# Patient Record
Sex: Male | Born: 1955 | Race: White | Hispanic: No | Marital: Married | Smoking: Former smoker
Health system: Southern US, Community
[De-identification: ages and names within clinical notes are randomized; demographics above are authoritative.]

## PROBLEM LIST (undated history)

## (undated) DIAGNOSIS — R079 Chest pain, unspecified: Secondary | ICD-10-CM

## (undated) DIAGNOSIS — R001 Bradycardia, unspecified: Secondary | ICD-10-CM

## (undated) DIAGNOSIS — J45909 Unspecified asthma, uncomplicated: Secondary | ICD-10-CM

## (undated) DIAGNOSIS — J449 Chronic obstructive pulmonary disease, unspecified: Secondary | ICD-10-CM

## (undated) HISTORY — PX: APPENDECTOMY: SHX54

## (undated) HISTORY — DX: Chronic obstructive pulmonary disease, unspecified: J44.9

## (undated) HISTORY — DX: Bradycardia, unspecified: R00.1

## (undated) HISTORY — DX: Unspecified asthma, uncomplicated: J45.909

## (undated) HISTORY — DX: Chest pain, unspecified: R07.9

---

## 2012-05-28 ENCOUNTER — Ambulatory Visit (INDEPENDENT_AMBULATORY_CARE_PROVIDER_SITE_OTHER): Payer: BC Managed Care – PPO | Admitting: Physician Assistant

## 2012-05-28 VITALS — BP 110/90 | HR 62 | Temp 98.0°F | Resp 16 | Ht 73.0 in | Wt 182.0 lb

## 2012-05-28 DIAGNOSIS — R05 Cough: Secondary | ICD-10-CM

## 2012-05-28 DIAGNOSIS — J069 Acute upper respiratory infection, unspecified: Secondary | ICD-10-CM

## 2012-05-28 MED ORDER — HYDROCOD POLST-CHLORPHEN POLST 10-8 MG/5ML PO LQCR: 5.0000 mL | Freq: Two times a day (BID) | ORAL | Status: DC

## 2012-05-28 MED ORDER — GUAIFENESIN ER 1200 MG PO TB12: 1.0000 | ORAL_TABLET | Freq: Two times a day (BID) | ORAL | Status: DC

## 2012-05-28 NOTE — Progress Notes (Signed)
  Subjective:    Patient ID: Taylor Morton, male    DOB: [birth year], 56 y.o.   MRN: [id number]  HPI Pt presents to clinic with 5 d h/o cold symptoms or allergies he is unsure.  He has mild sinus congestion and a dry cough that is like a tickle in his throat and in his chest.  He has had some SOB with his coughing.  He has EIA but this is different.  He does not have an inhaler and he is not interested in one.  He has tried no OTC medications.   Review of Systems  Constitutional: Negative for fever and chills.  HENT: Positive for congestion, rhinorrhea and sinus pressure. Negative for ear pain, sore throat and postnasal drip.   Respiratory: Positive for cough, shortness of breath (2nd to the cough) and wheezing (rare).        Objective:   Physical Exam  Vitals reviewed. Constitutional: He is oriented to person, place, and time. He appears well-developed and well-nourished.  HENT:  Head: Normocephalic and atraumatic.  Right Ear: Hearing, tympanic membrane, external ear and ear canal normal.  Left Ear: Hearing, tympanic membrane, external ear and ear canal normal.  Nose: Mucosal edema present.  Mouth/Throat: Uvula is midline and oropharynx is clear and moist.  Eyes: Conjunctivae are normal.  Neck: Neck supple.  Cardiovascular: Normal rate, regular rhythm and normal heart sounds.  Exam reveals no gallop and no friction rub.   No murmur heard. Pulmonary/Chest: Effort normal and breath sounds normal. He has no wheezes (even with forced expiration).  Neurological: He is alert and oriented to person, place, and time.  Skin: Skin is warm and dry.  Psychiatric: He has a normal mood and affect. His behavior is normal. Judgment and thought content normal.          Assessment & Plan:   1. Cough  chlorpheniramine-HYDROcodone (TUSSIONEX PENNKINETIC ER) 10-8 MG/5ML LQCR, Guaifenesin (MUCINEX MAXIMUM STRENGTH) 1200 MG TB12   Pt to push fluids, Tylenol/motrin prn.  If pt is having a productive  cough in 5 days - let me know and I will send in an abx but I really think this is viral or allergic.

## 2012-06-02 ENCOUNTER — Telehealth: Payer: Self-pay

## 2012-06-02 DIAGNOSIS — R05 Cough: Secondary | ICD-10-CM

## 2012-06-02 MED ORDER — GUAIFENESIN ER 1200 MG PO TB12: 1.0000 | ORAL_TABLET | Freq: Two times a day (BID) | ORAL | Status: DC

## 2012-06-02 MED ORDER — HYDROCOD POLST-CHLORPHEN POLST 10-8 MG/5ML PO LQCR: 5.0000 mL | Freq: Two times a day (BID) | ORAL | Status: DC

## 2012-06-02 NOTE — Telephone Encounter (Signed)
Spoke with patient and will try refilled meds first-- if symptoms continue, will CB for abx.

## 2012-06-02 NOTE — Telephone Encounter (Signed)
Refills done.

## 2012-06-02 NOTE — Telephone Encounter (Signed)
chlorpheniramine-HYDROcodone (TUSSIONEX PENNKINETIC ER) 10-8 MG/5ML LQCR, Guaifenesin (MUCINEX MAXIMUM STRENGTH) 1200 MG TB12    Pt to push fluids, Tylenol/motrin prn. If pt is having a productive cough in 5 days - let me know and I will send in an abx but I really think this is viral or allergic.  From office visit 05/28/12 office visit with Maralyn Sago

## 2012-06-02 NOTE — Telephone Encounter (Signed)
LMOM to CB.  Get symptoms to see if abx are necessary.  Rx faxed in.

## 2012-06-02 NOTE — Telephone Encounter (Signed)
Pt would like a refill on the tussinex and musinex that he was prescribed, pt states that the coughing and congestion have not resolved yet. Best# [phone] Pharmacy: [facility] [address]

## 2012-08-30 ENCOUNTER — Ambulatory Visit (INDEPENDENT_AMBULATORY_CARE_PROVIDER_SITE_OTHER): Payer: BC Managed Care – PPO | Admitting: Physician Assistant

## 2012-08-30 VITALS — BP 117/78 | HR 62 | Temp 98.1°F | Resp 18 | Ht 74.0 in | Wt 181.0 lb

## 2012-08-30 DIAGNOSIS — J45909 Unspecified asthma, uncomplicated: Secondary | ICD-10-CM

## 2012-08-30 DIAGNOSIS — R001 Bradycardia, unspecified: Secondary | ICD-10-CM

## 2012-08-30 DIAGNOSIS — R05 Cough: Secondary | ICD-10-CM

## 2012-08-30 DIAGNOSIS — R079 Chest pain, unspecified: Secondary | ICD-10-CM

## 2012-08-30 DIAGNOSIS — R1032 Left lower quadrant pain: Secondary | ICD-10-CM

## 2012-08-30 LAB — POCT URINALYSIS DIPSTICK
Bilirubin, UA: NEGATIVE
Glucose, UA: NEGATIVE
Leukocytes, UA: NEGATIVE
Spec Grav, UA: >=1.03

## 2012-08-30 LAB — POCT UA - MICROSCOPIC ONLY: Bacteria, U Microscopic: NEGATIVE

## 2012-08-30 MED ORDER — HYDROCOD POLST-CHLORPHEN POLST 10-8 MG/5ML PO LQCR: 5.0000 mL | Freq: Two times a day (BID) | ORAL | Status: DC | PRN

## 2012-08-30 MED ORDER — ALBUTEROL SULFATE HFA 108 (90 BASE) MCG/ACT IN AERS: 2.0000 | INHALATION_SPRAY | RESPIRATORY_TRACT | Status: DC | PRN

## 2012-08-30 NOTE — Patient Instructions (Signed)
Contact me with your preference for the next step in the evaluation of your abdominal pain: at CT scan (of the abdomen and pelvis) or a consult with the surgeon.

## 2012-08-30 NOTE — Progress Notes (Signed)
Subjective:    Patient ID: Taylor Morton, male    DOB: [birth year], 56 y.o.   MRN: [id number]  HPI This 56 y.o. male presents for evaluation of 2-3 weeks of left lower quadrant abdominal pain.  Stabbing, burning.  Constant, wax/wanes. SInce the pain started, he's had a GI bug with nausea, vomiting, diarhhea, all of which are now resolved.  Has had a cough for about a day, and requests a refill of Tussionex which he usually uses for cough.  He has asthma, but states that he doesn't have symptoms and doesn't use any maintenance or rescue meds. No other URI-type symptoms.  No fever, chills.  No urinary symptoms, but thinks it might be "a bladder problem." He has two hernias in the same general area, for which he was referred to surgery.  However, due to not having insurance at the time, he did not follow-up.  He is now employed and has insurance again.  He reports a history of low resting heart rate.  Past Medical History  Diagnosis Date  . Asthma    Past Surgical History  Procedure Date  . Appendectomy    Prior to Admission medications   Medication Sig Start Date End Date Taking? Authorizing Provider  CRANBERRY EXTRACT PO Take by mouth.   Yes Historical Provider, MD   No Known Allergies  History   Social History  . Marital Status: Married    Spouse Name: Annabelle Harman    Number of Children: 2  . Years of Education: 16   Occupational History  . School Adult nurse Issues   Social History Main Topics  . Smoking status: Former Games developer  . Smokeless tobacco: Never Used  . Alcohol Use: No  . Drug Use: No  . Sexually Active: Yes   Other Topics Concern  . Not on file   Social History Narrative   Lives with his wife.  Sons are both in college.   Family History  Problem Relation Age of Onset  . Cancer Father   . Arthritis Mother    Review of Systems As above.    Objective:   Physical Exam  Blood pressure 117/78, pulse 62, temperature 98.1 F (36.7  C), temperature source Oral, resp. rate 18, height 6\' 2"  (1.88 m), weight 181 lb (82.101 kg), SpO2 99.00%. Body mass index is 23.24 kg/(m^2). Well-developed, well nourished WM who is awake, alert and oriented, in NAD. HEENT: [place]/AT, PERRL, EOMI.  Sclera and conjunctiva are clear.  EAC are patent, TMs are normal in appearance. Nasal mucosa is pink and moist. OP is clear. Neck: supple, non-tender, no lymphadenopathy, thyromegaly. Heart: RRR, no murmur Lungs: normal effort, CTA Abdomen: normo-active bowel sounds, supple, no mass or organomegaly. Mild tenderness on the LEFT, just above the inguinal fold.  No palpable mass, bulge or hernia defect. Extremities: no cyanosis, clubbing or edema. Skin: warm and dry without rash. Psychologic: good mood and appropriate affect, normal speech and behavior.   Results for orders placed in visit on 08/30/12  POCT UA - MICROSCOPIC ONLY      Component Value Range   WBC, Ur, HPF, POC 1-5     RBC, urine, microscopic 0-1     Bacteria, U Microscopic neg     Mucus, UA large     Epithelial cells, urine per micros neg     Crystals, Ur, HPF, POC calcium oxalate     Casts, Ur, LPF, POC neg     Yeast,  UA neg    POCT URINALYSIS DIPSTICK      Component Value Range   Color, UA yellow     Clarity, UA clear     Glucose, UA neg     Bilirubin, UA neg     Ketones, UA trace     Spec Grav, UA >=1.030     Blood, UA neg     pH, UA 5.5     Protein, UA neg     Urobilinogen, UA 0.2     Nitrite, UA neg     Leukocytes, UA Negative     EKG reviewed with Dr. Dareen Piano: NSR, bradycardia. No evidence of ischemia or LVH.    Assessment & Plan:   1. Abdominal pain, LLQ  POCT UA - Microscopic Only, POCT urinalysis dipstick  2. Chest pain  EKG 12-Lead, Ambulatory referral to Cardiology  3. Cough  chlorpheniramine-HYDROcodone (TUSSIONEX PENNKINETIC ER) 10-8 MG/5ML LQCR  4. Asthma  albuterol (PROVENTIL HFA;VENTOLIN HFA) 108 (90 BASE) MCG/ACT inhaler   He will call me with his  choice for further evaluation of the pain, which I suspect is a hernia. We can start with a surgery consult, or with a CT scan. Proceed with cardiology eval, given the new CP.  I encourage him to use the albuterol inhaler as needed for cough, as it may be underlying asthma causing his cough.  He may need a maintenance product.  I encourage him to return in the next few months for a CPE and fasting labs.

## 2012-09-06 ENCOUNTER — Ambulatory Visit: Payer: Self-pay | Admitting: Cardiovascular Disease

## 2012-09-15 ENCOUNTER — Encounter: Payer: Self-pay | Admitting: *Deleted

## 2012-09-15 ENCOUNTER — Encounter: Payer: Self-pay | Admitting: Cardiology

## 2012-09-15 DIAGNOSIS — R001 Bradycardia, unspecified: Secondary | ICD-10-CM | POA: Insufficient documentation

## 2012-09-15 DIAGNOSIS — R079 Chest pain, unspecified: Secondary | ICD-10-CM | POA: Insufficient documentation

## 2012-09-15 DIAGNOSIS — J45909 Unspecified asthma, uncomplicated: Secondary | ICD-10-CM | POA: Insufficient documentation

## 2012-09-21 ENCOUNTER — Ambulatory Visit (HOSPITAL_COMMUNITY)
Admission: RE | Admit: 2012-09-21 | Discharge: 2012-09-21 | Disposition: A | Discharge status: Home / Self Care | Payer: BC Managed Care – PPO | Source: Ambulatory Visit | Attending: Cardiovascular Disease | Admitting: Cardiovascular Disease

## 2012-09-21 ENCOUNTER — Encounter: Payer: Self-pay | Admitting: Cardiovascular Disease

## 2012-09-21 ENCOUNTER — Ambulatory Visit (INDEPENDENT_AMBULATORY_CARE_PROVIDER_SITE_OTHER): Payer: BC Managed Care – PPO | Admitting: Cardiovascular Disease

## 2012-09-21 VITALS — BP 121/80 | HR 59 | Resp 16 | Ht 73.0 in | Wt 184.0 lb

## 2012-09-21 DIAGNOSIS — R079 Chest pain, unspecified: Secondary | ICD-10-CM | POA: Insufficient documentation

## 2012-09-21 NOTE — Patient Instructions (Addendum)
Your physician has requested that you have an exercise tolerance test. For further information please visit [email or web address] Please also follow instruction sheet, as given.  A chest x-ray takes a picture of the organs and structures inside the chest, including the heart, lungs, and blood vessels. This test can show several things, including, whether the heart is enlarges; whether fluid is building up in the lungs; and whether pacemaker / defibrillator leads are still in place.  Your physician recommends that you continue on your current medications as directed. Please refer to the Current Medication list given to you today.  Follow up with Dr. Eden Emms as needed. We will call you with your results.

## 2012-09-21 NOTE — Assessment & Plan Note (Signed)
PRN inhalers per urgent care No active wheezing

## 2012-09-21 NOTE — Progress Notes (Signed)
Patient ID: Taylor Morton, male   DOB: Dec 04, 1955, 56 y.o.   MRN: [id number] 56 yo seen at urgent care 3 weeks ago for chest pain.  Atypical.  Not always exertional Waxed and waned for a few hours radiated to left arm  Arm numbness lasted longer than chest pain.  At urgent care negative enzymes.  Some stress at school teaching.  Since then has been active and run last 5 days with no issues.  No dyspnea palpitations or syncope.  Quit smoking over 20 years ago.  Has some shoulder problems and can hurt if he sleeps on them wrong. Has had pains in chest before he attributed to indigestion.  No gi overtones this time.  Currently feels good   ROS: Denies fever, malais, weight loss, blurry vision, decreased visual acuity, cough, sputum, SOB, hemoptysis, pleuritic pain, palpitaitons, heartburn, abdominal pain, melena, lower extremity edema, claudication, or rash.  All other systems reviewed and negative   General: Affect appropriate Healthy:  appears stated age HEENT: normal Neck supple with no adenopathy JVP normal no bruits no thyromegaly Lungs clear with no wheezing and good diaphragmatic motion Heart:  S1/S2 no murmur,rub, gallop or click PMI normal Abdomen: benighn, BS positve, no tenderness, no AAA no bruit.  No HSM or HJR Distal pulses intact with no bruits No edema Neuro non-focal Skin warm and dry No muscular weakness  Medications Current Outpatient Prescriptions  Medication Sig Dispense Refill  . chlorpheniramine-HYDROcodone (TUSSIONEX PENNKINETIC ER) 10-8 MG/5ML LQCR Take 5 mLs by mouth every 12 (twelve) hours as needed (cough).  140 mL  0  . CRANBERRY EXTRACT PO Take by mouth.        Allergies Review of patient's allergies indicates no known allergies.  Family History: Family History  Problem Relation Age of Onset  . Cancer Father   . Arthritis Mother     Social History: History   Social History  . Marital Status: Married    Spouse Name: Annabelle Harman    Number of Children: 2   . Years of Education: 16   Occupational History  . School Adult nurse Issues   Social History Main Topics  . Smoking status: Former Games developer  . Smokeless tobacco: Never Used  . Alcohol Use: No  . Drug Use: No  . Sexually Active: Yes   Other Topics Concern  . Not on file   Social History Narrative   Lives with his wife.  Sons are both in college.    Electrocardiogram:  NSR normal ECG done 08/30/12  Assessment and Plan

## 2012-09-21 NOTE — Assessment & Plan Note (Signed)
Atypical with normal exam and ECG  F/U CXR given body habitus (R/O marfans check mediastinum)  F/U exercise ETT

## 2012-09-21 NOTE — Assessment & Plan Note (Signed)
Physiologic with no signs of pathologic slow HR

## 2012-09-26 ENCOUNTER — Telehealth: Payer: Self-pay | Admitting: Cardiovascular Disease

## 2012-09-26 NOTE — Telephone Encounter (Signed)
Pt rtn call, deleted message , not sure who called or why

## 2012-10-12 ENCOUNTER — Ambulatory Visit (INDEPENDENT_AMBULATORY_CARE_PROVIDER_SITE_OTHER): Payer: BC Managed Care – PPO | Admitting: Cardiovascular Disease

## 2012-10-12 DIAGNOSIS — R079 Chest pain, unspecified: Secondary | ICD-10-CM

## 2012-10-12 NOTE — Procedures (Signed)
Exercise Treadmill Test  Pre-Exercise Testing Evaluation Rhythm: normal sinus  Rate: 70     Test  Exercise Tolerance Test Ordering MD: Charlton Haws, MD  Interpreting MD: Charlton Haws, MD  Unique Test No: 1   Treadmill:  1  Indication for ETT: chest pain - rule out ischemia  Contraindication to ETT: No   Stress Modality: exercise - treadmill  Cardiac Imaging Performed: non   Protocol: standard Bruce - maximal  Max BP:  154 77  Max MPHR (bpm):   1 85% MPR (bpm):     MPHR obtained (bpm):  162 % MPHR obtained:   99%  Reached 85% MPHR (min:sec):    Total Exercise Time (min-sec):  8:00  Workload in METS:  13.1 Borg Scale: 18  Reason ETT Terminated:  fatigue    ST Segment Analysis At Rest: normal ST segments - no evidence of significant ST depression With Exercise: no evidence of significant ST depression  Other Information Arrhythmia:  No Angina during ETT:  absent (0) Quality of ETT:  diagnostic  ETT Interpretation:  normal - no evidence of ischemia by ST analysis  Comments: Normal ETT  Recommendations: F/U Primary

## 2013-05-31 ENCOUNTER — Ambulatory Visit: Payer: BC Managed Care – PPO

## 2013-05-31 ENCOUNTER — Ambulatory Visit (INDEPENDENT_AMBULATORY_CARE_PROVIDER_SITE_OTHER): Payer: BC Managed Care – PPO | Admitting: Family Medicine

## 2013-05-31 ENCOUNTER — Emergency Department (HOSPITAL_COMMUNITY)
Admission: EM | Admit: 2013-05-31 | Discharge: 2013-05-31 | Disposition: A | Discharge status: Home / Self Care | Payer: BC Managed Care – PPO | Attending: Emergency Medicine | Admitting: Emergency Medicine

## 2013-05-31 ENCOUNTER — Encounter (HOSPITAL_COMMUNITY): Payer: Self-pay | Admitting: Emergency Medicine

## 2013-05-31 VITALS — BP 170/100 | HR 56 | Temp 97.9°F | Resp 18 | Ht 73.0 in | Wt 174.0 lb

## 2013-05-31 DIAGNOSIS — M542 Cervicalgia: Secondary | ICD-10-CM

## 2013-05-31 DIAGNOSIS — R05 Cough: Secondary | ICD-10-CM

## 2013-05-31 DIAGNOSIS — R079 Chest pain, unspecified: Secondary | ICD-10-CM

## 2013-05-31 DIAGNOSIS — Z8679 Personal history of other diseases of the circulatory system: Secondary | ICD-10-CM | POA: Insufficient documentation

## 2013-05-31 DIAGNOSIS — M25512 Pain in left shoulder: Secondary | ICD-10-CM

## 2013-05-31 DIAGNOSIS — Z87891 Personal history of nicotine dependence: Secondary | ICD-10-CM | POA: Insufficient documentation

## 2013-05-31 DIAGNOSIS — J45909 Unspecified asthma, uncomplicated: Secondary | ICD-10-CM | POA: Insufficient documentation

## 2013-05-31 DIAGNOSIS — R9431 Abnormal electrocardiogram [ECG] [EKG]: Secondary | ICD-10-CM

## 2013-05-31 DIAGNOSIS — R0789 Other chest pain: Secondary | ICD-10-CM

## 2013-05-31 DIAGNOSIS — M25519 Pain in unspecified shoulder: Secondary | ICD-10-CM | POA: Insufficient documentation

## 2013-05-31 DIAGNOSIS — R030 Elevated blood-pressure reading, without diagnosis of hypertension: Secondary | ICD-10-CM

## 2013-05-31 LAB — BASIC METABOLIC PANEL
Calcium: 9.1 mg/dL (ref 8.4–10.5)
GFR calc non Af Amer: >90 mL/min (ref >90)
Glucose, Bld: 99 mg/dL (ref 70–99)
Sodium: 135 mEq/L (ref 135–145)

## 2013-05-31 LAB — POCT I-STAT TROPONIN I: Troponin i, poc: 0.01 ng/mL (ref 0.00–0.08)

## 2013-05-31 LAB — CBC
MCH: 32.5 pg (ref 26.0–34.0)
MCHC: 36 g/dL (ref 30.0–36.0)
Platelets: 253 10*3/uL (ref 150–400)
RBC: 4.52 MIL/uL (ref 4.22–5.81)
RDW: 12.3 % (ref 11.5–15.5)

## 2013-05-31 MED ORDER — HYDROCOD POLST-CHLORPHEN POLST 10-8 MG/5ML PO LQCR: 5.0000 mL | Freq: Two times a day (BID) | ORAL | Status: DC | PRN

## 2013-05-31 NOTE — Patient Instructions (Signed)
Your EKG has changed since last checked.  These changes are not specific, but due to the type of pain you are having - further workup in the emergency room is indicated. Go to [facility] emergency room.  I will let them know you are on your way. If any change in your symptoms on your way there - pull over and call 911. If follow up needed after the emergency room, can return here at your convenience.

## 2013-05-31 NOTE — ED Provider Notes (Signed)
CSN: [id number]     Arrival date & time 05/31/13  1157 History   First MD Initiated Contact with Patient 05/31/13 1236     Chief Complaint  Patient presents with  . Chest Pain  . Shoulder Pain   (Consider location/radiation/quality/duration/timing/severity/associated sxs/prior Treatment) Patient is a 57 y.o. male presenting with chest pain and shoulder pain.  Chest Pain Shoulder Pain Associated symptoms include chest pain.   Pt reports about 10 days of moderate aching L shoulder and neck pain, improved with massage. Today he noticed some aching in L upper chest wall, worse with breathing deep. He has been running and paddling kayak recently without any exacerbation in symptoms. Seen at [facility] and sent to the ED for further evaluation. He was seen at [facility] for similar about 9 months ago and had normal exercise stress test then. Reports travel about 5 weeks ago but none in last 2 weeks.   Past Medical History  Diagnosis Date  . Asthma   . Chest pain   . Bradycardia    Past Surgical History  Procedure Laterality Date  . Appendectomy     Family History  Problem Relation Age of Onset  . Cancer Father   . Arthritis Mother    History  Substance Use Topics  . Smoking status: Former Games developer  . Smokeless tobacco: Never Used  . Alcohol Use: No    Review of Systems  Cardiovascular: Positive for chest pain.   All other systems reviewed and are negative except as noted in HPI.   Allergies  Review of patient's allergies indicates no known allergies.  Home Medications   Current Outpatient Rx  Name  Route  Sig  Dispense  Refill  . ibuprofen (ADVIL,MOTRIN) 200 MG tablet   Oral   Take 200 mg by mouth every 6 (six) hours as needed for pain (pain).         . chlorpheniramine-HYDROcodone (TUSSIONEX PENNKINETIC ER) 10-8 MG/5ML LQCR   Oral   Take 5 mLs by mouth every 12 (twelve) hours as needed (cough).   140 mL   0   . CRANBERRY EXTRACT PO   Oral   Take by mouth.           BP 168/76  Pulse 47  Temp(Src) 98.5 F (36.9 C) (Oral)  Resp 18  SpO2 97% Physical Exam  Nursing note and vitals reviewed. Constitutional: He is oriented to person, place, and time. He appears well-developed and well-nourished.  HENT:  Head: Normocephalic and atraumatic.  Eyes: EOM are normal. Pupils are equal, round, and reactive to light.  Neck: Normal range of motion. Neck supple.  Cardiovascular: Normal rate, normal heart sounds and intact distal pulses.   Pulmonary/Chest: Effort normal and breath sounds normal. No respiratory distress. He has no wheezes. He has no rales. He exhibits no tenderness.  Abdominal: Bowel sounds are normal. He exhibits no distension. There is no tenderness.  Musculoskeletal: Normal range of motion. He exhibits no edema and no tenderness.  Neurological: He is alert and oriented to person, place, and time. He has normal strength. No cranial nerve deficit or sensory deficit.  Skin: Skin is warm and dry. No rash noted.  Psychiatric: He has a normal mood and affect.    ED Course  Procedures (including critical care time) Labs Review Labs Reviewed  BASIC METABOLIC PANEL  CBC  POCT I-STAT TROPONIN I   Imaging Review Dg Chest 2 View  05/31/2013   *RADIOLOGY REPORT*  Clinical Data: Cough.  CHEST - 2 VIEW  Comparison: None.  Findings: There is no evidence of acute infiltrate, pulmonary edema, nodule, pleural fluid or pneumothorax.  Lungs mildly hyperinflated.  The heart size and mediastinal contours are within normal limits.  Bony thorax is unremarkable.  IMPRESSION: Mild hyperinflation.  No acute findings.  Clinically significant discrepancy from primary report, if provided: None   Original Report Authenticated By: Irish Lack, M.D.   Dg Cervical Spine Complete  05/31/2013   *RADIOLOGY REPORT*  Clinical Data: Neck pain.  CERVICAL SPINE - COMPLETE 4+ VIEW  Comparison: None.  Findings: The cervical spine shows normal alignment.  Minimal osteophyte  formation is noted at C5-6 and there is mild disc space narrowing at C6-7.  No fracture or subluxation is identified. Oblique views show no significant bony foraminal stenosis.  No soft tissue swelling is identified.  IMPRESSION: Mild cervical spondylosis at C5-6 and C6-7.  Clinically significant discrepancy from primary report, if provided: None   Original Report Authenticated By: Irish Lack, M.D.    MDM   1. Shoulder pain, left      Date: 05/31/2013  Rate: 49  Rhythm: sinus bradycardia  QRS Axis: normal  Intervals: normal  ST/T Wave abnormalities: normal  Conduction Disutrbances:none  Narrative Interpretation:   Old EKG Reviewed: unchanged  Unremarkable EKG, history not concerning for ACS or PE. Labs and imagine ordered in [facility] reviewed and unremarkable. Pt with MSK shoulder pain, advised to avoid strenuous activity. PCP followup.      Rudolph Daoust B. Bernette Mayers, MD 05/31/13 1332

## 2013-05-31 NOTE — ED Notes (Signed)
Pt sent from [facility] for further eval of left shoulder pain and chest pain; pt sts pain x 10 days; pt sts thinks could be pulled muscles; pt noted to be htn

## 2013-05-31 NOTE — Progress Notes (Signed)
Subjective:    Patient ID: Taylor Morton, male    DOB: Aug 04, 1956, 57 y.o.   MRN: [id number]  HPI Taylor Morton is a 57 y.o. male  Has had some pain in L upper chest for past week, hurts to take in deep breath. No pressure. Not short of breath.  Able to run yesterday - no pain then. Hurts more in L neck/upper back/below shoulder blade and front L upper chest - this area hurts with deep breath. Cough past week - min clear phlegm in am, no fever. Has had belching without heartburn. No diaphoresis, no N/V.  No known hx of HTN.   Tx: few Advil - no relief.   Eval for chest pain in 08/2012 - D.r Nishan. Atypical with normal EKG, ETT wnl, no st changes, 13.1 mets 10/12/12.   dilated eye exam this morning with phenylephrine.   Administrator, arts. Nonsmoker/former smoker.   Additional history: drove to [place] few weeks ago, but no air travel, no new calf pain/swelling.  Review of Systems  Constitutional: Negative for fever and chills.  Respiratory: Positive for cough. Negative for shortness of breath.   Cardiovascular: Positive for chest pain (upper left chest. ).  Musculoskeletal: Positive for arthralgias.  Skin: Negative for rash.       Objective:   Physical Exam  Vitals reviewed. Constitutional: He is oriented to person, place, and time. He appears well-developed and well-nourished.  HENT:  Head: Normocephalic and atraumatic.  Eyes: EOM are normal. Pupils are equal, round, and reactive to light.  Neck: No JVD present. Carotid bruit is not present.  Cardiovascular: Normal rate, regular rhythm and normal heart sounds.   No murmur heard. Pulmonary/Chest: Effort normal and breath sounds normal. No respiratory distress. He has no rales.  Musculoskeletal: He exhibits no edema.       Cervical back: He exhibits decreased range of motion (some neck to shoulder blade tightness with L rotation, L lateral flexion - slightly decreased. ). He exhibits no tenderness and no bony tenderness.  Neurological:  He is alert and oriented to person, place, and time.  Skin: Skin is warm and dry.  Psychiatric: He has a normal mood and affect. His behavior is normal.   [facility] reading (PRIMARY) by  Dr. Neva Seat:  CXR:no apparent pneumothorax, NAD C Spine: minimal decreased lordosis.   EKG: sinus bradycardia at rate 48, incomplete RBBB-  RSR' in V1 - also seen in 08/30/12 EKG, but new TWI in III, nonspecific change in AVF, and slight elevation in ST segments V2-V3 vs. Early repol- appears more prevalent than in 08/2012 EKG. - discussed with cardiologist - nonspecific findings, but did recommend ER eval for further workup.      Assessment & Plan:  Taylor Morton is a 57 y.o. male Chest pain, unspecified, Neck pain  - Plan: EKG 12-Lead, EKG 12-Lead, Nonspecific abnormal electrocardiogram (ECG) (EKG), slight Cough -  Nonspecific EKG cahnge, but hx of prolonged car travel few weeks ago - with pleuritic type pain. Exam and hx suggest cervical source, but due to EKG change and travel - will have further workup in ER for other causes, including possible D dimer or CT of chest - but TBD by eval in ER. Discussed with pt - understanding expressed.   Elevated blood pressure - no hx of HTN - had dilated eye exam this am. May be due to these meds. Can recheck.   Patient Instructions  Your EKG has changed since last checked.  These changes are not specific, but  due to the type of pain you are having - further workup in the emergency room is indicated. Go to [facility] emergency room.  I will let them know you are on your way. If any change in your symptoms on your way there - pull over and call 911. If follow up needed after the emergency room, can return here at your convenience.

## 2014-04-16 ENCOUNTER — Ambulatory Visit (INDEPENDENT_AMBULATORY_CARE_PROVIDER_SITE_OTHER): Payer: BC Managed Care – PPO

## 2014-04-16 ENCOUNTER — Ambulatory Visit (INDEPENDENT_AMBULATORY_CARE_PROVIDER_SITE_OTHER): Payer: BC Managed Care – PPO | Admitting: Family Medicine

## 2014-04-16 VITALS — BP 140/82 | HR 68 | Temp 98.2°F | Resp 16 | Ht 74.0 in | Wt 179.4 lb

## 2014-04-16 DIAGNOSIS — R3129 Other microscopic hematuria: Secondary | ICD-10-CM

## 2014-04-16 DIAGNOSIS — IMO0002 Reserved for concepts with insufficient information to code with codable children: Secondary | ICD-10-CM

## 2014-04-16 DIAGNOSIS — S20219A Contusion of unspecified front wall of thorax, initial encounter: Secondary | ICD-10-CM

## 2014-04-16 DIAGNOSIS — S3991XA Unspecified injury of abdomen, initial encounter: Secondary | ICD-10-CM

## 2014-04-16 DIAGNOSIS — S20211A Contusion of right front wall of thorax, initial encounter: Secondary | ICD-10-CM

## 2014-04-16 DIAGNOSIS — S2239XA Fracture of one rib, unspecified side, initial encounter for closed fracture: Secondary | ICD-10-CM

## 2014-04-16 DIAGNOSIS — S2231XA Fracture of one rib, right side, initial encounter for closed fracture: Secondary | ICD-10-CM

## 2014-04-16 LAB — POCT URINALYSIS DIPSTICK
BILIRUBIN UA: NEGATIVE
GLUCOSE UA: NEGATIVE
KETONES UA: NEGATIVE
Leukocytes, UA: NEGATIVE
NITRITE UA: NEGATIVE
PH UA: 6.5
Protein, UA: NEGATIVE
SPEC GRAV UA: 1.015
Urobilinogen, UA: 0.2

## 2014-04-16 LAB — POCT UA - MICROSCOPIC ONLY
Casts, Ur, LPF, POC: NEGATIVE
Crystals, Ur, HPF, POC: NEGATIVE
EPITHELIAL CELLS, URINE PER MICROSCOPY: NEGATIVE
WBC, UR, HPF, POC: NEGATIVE
Yeast, UA: NEGATIVE

## 2014-04-16 MED ORDER — MELOXICAM 15 MG PO TABS: 15.0000 mg | ORAL_TABLET | Freq: Every day | ORAL | Status: DC

## 2014-04-16 MED ORDER — HYDROCODONE-ACETAMINOPHEN 5-325 MG PO TABS: 1.0000 | ORAL_TABLET | Freq: Four times a day (QID) | ORAL | Status: DC | PRN

## 2014-04-16 NOTE — Patient Instructions (Addendum)
Do not use the meloxicam with any other otc pain medication other than tylenol/acetaminophen - so no aleve, ibuprofen, motrin, advil, etc.  Rib Contusion A rib contusion (bruise) can occur by a blow to the chest or by a fall against a hard object. Usually these will be much better in a couple weeks. If X-rays were taken today and there are no broken bones (fractures), the diagnosis of bruising is made. However, broken ribs may not show up for several days, or may be discovered later on a routine X-ray when signs of healing show up. If this happens to you, it does not mean that something was missed on the X-ray, but simply that it did not show up on the first X-rays. Earlier diagnosis will not usually change the treatment. HOME CARE INSTRUCTIONS   Avoid strenuous activity. Be careful during activities and avoid bumping the injured ribs. Activities that pull on the injured ribs and cause pain should be avoided, if possible.  For the first day or two, an ice pack used every 20 minutes while awake may be helpful. Put ice in a plastic bag and put a towel between the bag and the skin.  Eat a normal, well-balanced diet. Drink plenty of fluids to avoid constipation.  Take deep breaths several times a day to keep lungs free of infection. Try to cough several times a day. Splint the injured area with a pillow while coughing to ease pain. Coughing can help prevent pneumonia.  Wear a rib belt or binder only if told to do so by your caregiver. If you are wearing a rib belt or binder, you must do the breathing exercises as directed by your caregiver. If not used properly, rib belts or binders restrict breathing which can lead to pneumonia.  Only take over-the-counter or prescription medicines for pain, discomfort, or fever as directed by your caregiver. SEEK MEDICAL CARE IF:   You or your child has an oral temperature above 102 F (38.9 C).  Your baby is older than 3 months with a rectal temperature of  100.5 F (38.1 C) or higher for more than 1 day.  You develop a cough, with thick or bloody sputum. SEEK IMMEDIATE MEDICAL CARE IF:   You have difficulty breathing.  You feel sick to your stomach (nausea), have vomiting or belly (abdominal) pain.  You have worsening pain, not controlled with medications, or there is a change in the location of the pain.  You develop sweating or radiation of the pain into the arms, jaw or shoulders, or become light headed or faint.  You or your child has an oral temperature above 102 F (38.9 C), not controlled by medicine.  Your or your baby is older than 3 months with a rectal temperature of 102 F (38.9 C) or higher.  Your baby is 503 months old or younger with a rectal temperature of 100.4 F (38 C) or higher. MAKE SURE YOU:   Understand these instructions.  Will watch your condition.  Will get help right away if you are not doing well or get worse. Document Released: 06/08/2001 Document Revised: 01/08/2013 Document Reviewed: 05/01/2008 [facility] Patient Information 2015 [employer] [place]. This information is not intended to replace advice given to you by your health care provider. Make sure you discuss any questions you have with your health care provider.
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
## 2014-10-11 ENCOUNTER — Ambulatory Visit (INDEPENDENT_AMBULATORY_CARE_PROVIDER_SITE_OTHER): Payer: BC Managed Care – PPO | Admitting: Emergency Medicine

## 2014-10-11 ENCOUNTER — Ambulatory Visit (INDEPENDENT_AMBULATORY_CARE_PROVIDER_SITE_OTHER): Payer: BC Managed Care – PPO

## 2014-10-11 VITALS — BP 132/82 | HR 77 | Temp 98.0°F | Resp 18 | Ht 73.0 in | Wt 184.0 lb

## 2014-10-11 DIAGNOSIS — R059 Cough, unspecified: Secondary | ICD-10-CM

## 2014-10-11 DIAGNOSIS — R05 Cough: Secondary | ICD-10-CM

## 2014-10-11 DIAGNOSIS — J209 Acute bronchitis, unspecified: Secondary | ICD-10-CM

## 2014-10-11 DIAGNOSIS — R197 Diarrhea, unspecified: Secondary | ICD-10-CM

## 2014-10-11 DIAGNOSIS — Z1211 Encounter for screening for malignant neoplasm of colon: Secondary | ICD-10-CM

## 2014-10-11 LAB — POCT CBC
GRANULOCYTE PERCENT: 68.1 % (ref 37–80)
HEMATOCRIT: 45.2 % (ref 43.5–53.7)
HEMOGLOBIN: 15.6 g/dL (ref 14.1–18.1)
Lymph, poc: 1.5 (ref 0.6–3.4)
MCH, POC: 32 pg — AB (ref 27–31.2)
MCHC: 34.7 g/dL (ref 31.8–35.4)
MCV: 92.4 fL (ref 80–97)
MID (CBC): 0.5 (ref 0–0.9)
MPV: 7.6 fL (ref 0–99.8)
PLATELET COUNT, POC: 334 10*3/uL (ref 142–424)
POC Granulocyte: 4.2 (ref 2–6.9)
POC LYMPH %: 23.6 % (ref 10–50)
POC MID %: 8.3 %M (ref 0–12)
RBC: 4.89 M/uL (ref 4.69–6.13)
RDW, POC: 12.2 %
WBC: 6.2 10*3/uL (ref 4.6–10.2)

## 2014-10-11 MED ORDER — AZITHROMYCIN 250 MG PO TABS: ORAL_TABLET | ORAL | Status: DC

## 2014-10-11 NOTE — Progress Notes (Addendum)
Subjective:    Patient ID: Taylor Morton, male    DOB: 24-Jun-1956, 59 y.o.   MRN: [id number] This chart was scribed for Lesle Chris, MD by Jolene Provost, Medical Scribe. This patient was seen in Room 11 and the patient's care was started at 8:18 AM.  Chief Complaint  Patient presents with  . URI  . Headache    HPI HPI Comments: Taylor Morton is a 59 y.o. male who presents to [facility] complaining of URI that started two weeks ago. Pt states that his sx started with diarrhea, and then he developed sinus congestion, HA and mild fever/chills. Pt endorses continued associated diarrhea, fatigue, fever, chills, HA, congestion, mildly productive cough with clear and yellow drainage. Pt has taken 12 hour mucinex PTA with some relief of sx. Pt denies sick contacts. Pt denies other medical problems. PT does not smoke.  Pt is a Radio producer.    Review of Systems  Constitutional: Positive for fever, chills and fatigue.  HENT: Positive for congestion, rhinorrhea, sinus pressure and sore throat.   Respiratory: Positive for cough.   Gastrointestinal: Positive for diarrhea.  Neurological: Positive for headaches.  Psychiatric/Behavioral: Positive for sleep disturbance.   patient states he has had some visual disturbance associated with a headache after working on the computer through the day yesterday. He does have a family history of macular degeneration and has not had a good thorough eye check for a year and a half     Objective:   Physical Exam  Constitutional: He is oriented to person, place, and time. He appears well-developed and well-nourished.  HENT:  Head: Normocephalic and atraumatic.  Mouth/Throat: No oropharyngeal exudate.  Nasal congestion.  Eyes: Pupils are equal, round, and reactive to light.  Neck: Neck supple. No thyromegaly present.  Cardiovascular: Normal rate and regular rhythm.   Pulmonary/Chest: Effort normal and breath sounds normal. No respiratory distress.  Few course  rhonchi.  Abdominal: Soft. There is no tenderness.  Neurological: He is alert and oriented to person, place, and time.  Skin: Skin is warm and dry.  Psychiatric: He has a normal mood and affect. His behavior is normal.  Nursing note and vitals reviewed.  Results for orders placed or performed in visit on 10/11/14  POCT CBC  Result Value Ref Range   WBC 6.2 4.6 - 10.2 K/uL   Lymph, poc 1.5 0.6 - 3.4   POC LYMPH PERCENT 23.6 10 - 50 %L   MID (cbc) 0.5 0 - 0.9   POC MID % 8.3 0 - 12 %M   POC Granulocyte 4.2 2 - 6.9   Granulocyte percent 68.1 37 - 80 %G   RBC 4.89 4.69 - 6.13 M/uL   Hemoglobin 15.6 14.1 - 18.1 g/dL   HCT, POC 57.8 46.9 - 53.7 %   MCV 92.4 80 - 97 fL   MCH, POC 32.0 (A) 27 - 31.2 pg   MCHC 34.7 31.8 - 35.4 g/dL   RDW, POC 62.9 %   Platelet Count, POC 334 142 - 424 K/uL   MPV 7.6 0 - 99.8 fL  [facility] reading (PRIMARY) by  Dr.Emanuel Campos there appears to be having markings worse on the left  no consolidated  areas.      Assessment & Plan:  We'll treat with a Z-Pak. He is to force fluids. He was referred to Dr. Elnoria Howard for screening colonoscopy. I suggested he get a good thorough eye exam. He does not 1 schedule that at the present time. I  also encouraged schedule for complete physical. He really does not want to do that at present time.I personally performed the services described in this documentation, which was scribed in my presence. The recorded information has been reviewed and is accurate.

## 2015-10-20 ENCOUNTER — Ambulatory Visit (INDEPENDENT_AMBULATORY_CARE_PROVIDER_SITE_OTHER): Payer: BC Managed Care – PPO

## 2015-10-20 ENCOUNTER — Ambulatory Visit (INDEPENDENT_AMBULATORY_CARE_PROVIDER_SITE_OTHER): Payer: BC Managed Care – PPO | Admitting: Family Medicine

## 2015-10-20 ENCOUNTER — Encounter: Payer: Self-pay | Admitting: Family Medicine

## 2015-10-20 VITALS — BP 160/90 | HR 63 | Temp 98.6°F | Wt 186.0 lb

## 2015-10-20 DIAGNOSIS — R05 Cough: Secondary | ICD-10-CM

## 2015-10-20 DIAGNOSIS — R042 Hemoptysis: Secondary | ICD-10-CM

## 2015-10-20 DIAGNOSIS — R059 Cough, unspecified: Secondary | ICD-10-CM

## 2015-10-20 DIAGNOSIS — R51 Headache: Secondary | ICD-10-CM | POA: Diagnosis not present

## 2015-10-20 DIAGNOSIS — J209 Acute bronchitis, unspecified: Secondary | ICD-10-CM

## 2015-10-20 DIAGNOSIS — R519 Headache, unspecified: Secondary | ICD-10-CM

## 2015-10-20 MED ORDER — AZITHROMYCIN 250 MG PO TABS: ORAL_TABLET | ORAL | Status: DC

## 2015-10-20 MED ORDER — HYDROCODONE-HOMATROPINE 5-1.5 MG/5ML PO SYRP: 5.0000 mL | ORAL_SOLUTION | Freq: Three times a day (TID) | ORAL | Status: DC | PRN

## 2015-10-20 NOTE — Patient Instructions (Signed)
Chest x-ray just shows thickened bronchial tubes consistent with bronchitis

## 2015-10-20 NOTE — Progress Notes (Signed)
Subjective:  This chart was scribed for Elvina Sidle MD, by Veverly Fells, at [facility].  This patient was seen in room 12 and the patient's care was started at 8:18 AM.   Chief Complaint  Patient presents with  . Cough    x 1 week, green product - today coughed up blood  . Sinusitis  . Headache     Patient ID: Taylor Morton, male    DOB: 1955/11/03, 60 y.o.   MRN: [id number]  HPI  HPI Comments: Taylor Morton is a 60 y.o. male who presents to the [facility] complaining of a productive cough/ hemoptysis (green mucous with "a little bit" blood) onset 1 week ago. He states that the blood was only present when he woke up first thing in the morning.  He states that the cough keeps him up at night.   He has associated symptoms of chest and back pain from coughing frequently and well as sinus pressure,congestion and a headache. His symptoms first started with wheezing.  Patient has been using Mucin-Ex but denies any relief.  He denies a sore throat. Patient still runs occasionally and swims as well.   Blood pressure left arm: 150/70  Patient is an Manufacturing systems engineer.   Patients cardiologist is Dr. Eden Emms. His last EKG was normal.     Patient Active Problem List   Diagnosis Date Noted  . Asthma   . Chest pain   . Bradycardia    Past Medical History  Diagnosis Date  . Asthma   . Chest pain   . Bradycardia    Past Surgical History  Procedure Laterality Date  . Appendectomy     No Known Allergies Prior to Admission medications   Medication Sig Start Date End Date Taking? Authorizing Provider  azithromycin (ZITHROMAX) 250 MG tablet Take 2 tabs PO x 1 dose, then 1 tab PO QD x 4 days 10/11/14   Collene Gobble, MD   Social History   Social History  . Marital Status: Married    Spouse Name: Annabelle Harman  . Number of Children: 2  . Years of Education: 16   Occupational History  . School Adult nurse  Issues   Social History Main Topics  . Smoking status: Former Games developer  . Smokeless tobacco: Never Used  . Alcohol Use: No  . Drug Use: No  . Sexual Activity: Yes   Other Topics Concern  . Not on file   Social History Narrative   Lives with his wife.  Sons are both in college.     Review of Systems  Constitutional: Negative for fever, chills and diaphoresis.  HENT: Positive for congestion and sinus pressure. Negative for sore throat.   Eyes: Negative for pain, redness and itching.  Respiratory: Positive for cough. Negative for choking and shortness of breath.   Cardiovascular: Positive for chest pain.  Gastrointestinal: Negative for nausea and vomiting.  Musculoskeletal: Positive for back pain. Negative for neck pain and neck stiffness.  Skin: Negative for color change and wound.  Neurological: Positive for headaches. Negative for seizures, syncope and speech difficulty.       Objective:   Physical Exam  Constitutional: He is oriented to person, place, and time. He appears well-developed and well-nourished. No distress.  HENT:  Head: Normocephalic and atraumatic.  Throat is mildly erythematous.  No adenopathy.   Eyes: Pupils are equal, round, and reactive to light.  Neck: Normal range of motion.  Pulmonary/Chest: Effort normal. No respiratory distress.  decreased breath sounds both sides   Musculoskeletal: Normal range of motion.  Neurological: He is alert and oriented to person, place, and time.  Skin: Skin is warm.  Psychiatric: He has a normal mood and affect. His behavior is normal.   Filed Vitals:   10/20/15 0812 10/20/15 0815  BP: 150/80 160/90  Pulse: 63   Temp: 98.6 F (37 C)   TempSrc: Oral   Weight: 186 lb (84.369 kg)   SpO2: 97%     [facility] (PRIMARY) x-ray report read by Dr. Milus Glazier MD,  Chest X Ray: he has bronchial thickening. No infiltrate, normal heart size.      Assessment & Plan:   This chart was scribed in my presence and reviewed by me  personally.    ICD-9-CM ICD-10-CM   1. Hemoptysis 786.30 R04.2 DG Chest 2 View     azithromycin (ZITHROMAX) 250 MG tablet     HYDROcodone-homatropine (HYCODAN) 5-1.5 MG/5ML syrup  2. Cough 786.2 R05 DG Chest 2 View     azithromycin (ZITHROMAX) 250 MG tablet     HYDROcodone-homatropine (HYCODAN) 5-1.5 MG/5ML syrup  3. Headache, unspecified headache type 784.0 R51      Signed, Elvina Sidle, MD

## 2015-11-07 ENCOUNTER — Ambulatory Visit (INDEPENDENT_AMBULATORY_CARE_PROVIDER_SITE_OTHER): Payer: BC Managed Care – PPO | Admitting: Family Medicine

## 2015-11-07 VITALS — BP 155/84 | HR 75 | Temp 98.7°F | Resp 16 | Ht 74.5 in | Wt 186.0 lb

## 2015-11-07 DIAGNOSIS — R030 Elevated blood-pressure reading, without diagnosis of hypertension: Secondary | ICD-10-CM

## 2015-11-07 DIAGNOSIS — IMO0001 Reserved for inherently not codable concepts without codable children: Secondary | ICD-10-CM

## 2015-11-07 DIAGNOSIS — J101 Influenza due to other identified influenza virus with other respiratory manifestations: Secondary | ICD-10-CM

## 2015-11-07 MED ORDER — HYDROCOD POLST-CPM POLST ER 10-8 MG/5ML PO SUER: 5.0000 mL | Freq: Two times a day (BID) | ORAL | Status: DC | PRN

## 2015-11-07 NOTE — Progress Notes (Signed)
Subjective:    Patient ID: Taylor Morton, male    DOB: 03-Aug-1956, 60 y.o.   MRN: [id number]  11/07/2015  Headache; Sinusitis; and Sore Throat   HPI This 60 y.o. male presents for evaluation of flu like symptoms today. Having headache, sinus pressure.  Coughing a lot today.  No fever but +chills/sweats.  +body aches in chest and shoulder.  No sore throat or ear pain; +tinnitus.  +rhinorrhea minimal; +nasal congestion; coughing a lot; no SOB; wheezing cough.  No n/v/d.  No flu vaccine.  Students at school with GI.  Tussionex, Mucinex.  Wife tested positive today for influenza A.    Blood pressure has been elevated on several occasions in the past; refuses medication; eats excessive salt in diet.   Evaluated two weeks ago; diagnosed with bronchitis; prescribed Zpack, Tussionex.  Improved with acute illness.  Review of Systems  Constitutional: Positive for chills and fatigue. Negative for fever, diaphoresis, activity change and appetite change.  HENT: Positive for congestion, rhinorrhea and tinnitus. Negative for ear pain, sore throat and trouble swallowing.   Respiratory: Positive for cough and wheezing. Negative for shortness of breath.   Cardiovascular: Negative for chest pain, palpitations and leg swelling.  Gastrointestinal: Negative for nausea, vomiting, abdominal pain and diarrhea.  Endocrine: Negative for cold intolerance, heat intolerance, polydipsia, polyphagia and polyuria.  Skin: Negative for color change, rash and wound.  Neurological: Positive for headaches. Negative for dizziness, tremors, seizures, syncope, facial asymmetry, speech difficulty, weakness, light-headedness and numbness.  Psychiatric/Behavioral: Negative for sleep disturbance and dysphoric mood. The patient is not nervous/anxious.     Past Medical History  Diagnosis Date  . Asthma   . Chest pain   . Bradycardia    Past Surgical History  Procedure Laterality Date  . Appendectomy     No Known  Allergies Current Outpatient Prescriptions  Medication Sig Dispense Refill  . chlorpheniramine-HYDROcodone (TUSSIONEX PENNKINETIC ER) 10-8 MG/5ML SUER Take 5 mLs by mouth every 12 (twelve) hours as needed for cough. 180 mL 0   No current facility-administered medications for this visit.   Social History   Social History  . Marital Status: Married    Spouse Name: Annabelle Harman  . Number of Children: 2  . Years of Education: 16   Occupational History  . School Adult nurse Issues   Social History Main Topics  . Smoking status: Former Games developer  . Smokeless tobacco: Never Used  . Alcohol Use: No  . Drug Use: No  . Sexual Activity: Yes   Other Topics Concern  . Not on file   Social History Narrative   Lives with his wife.  Sons are both in college.   Family History  Problem Relation Age of Onset  . Cancer Father   . Arthritis Mother        Objective:    BP 155/84 mmHg  Pulse 75  Temp(Src) 98.7 F (37.1 C)  Resp 16  Ht 6' 2.5" (1.892 m)  Wt 186 lb (84.369 kg)  BMI 23.57 kg/m2  SpO2 97% Physical Exam  Constitutional: He is oriented to person, place, and time. He appears well-developed and well-nourished. No distress.  HENT:  Head: Normocephalic and atraumatic.  Right Ear: Tympanic membrane, external ear and ear canal normal.  Left Ear: Tympanic membrane, external ear and ear canal normal.  Nose: Mucosal edema and rhinorrhea present. Right sinus exhibits no maxillary sinus tenderness and no frontal sinus tenderness. Left sinus exhibits  no maxillary sinus tenderness and no frontal sinus tenderness.  Mouth/Throat: Oropharynx is clear and moist.  Eyes: Conjunctivae and EOM are normal. Pupils are equal, round, and reactive to light.  Neck: Normal range of motion. Neck supple. Carotid bruit is not present. No thyromegaly present.  Cardiovascular: Normal rate, regular rhythm, normal heart sounds and intact distal pulses.  Exam reveals no gallop and  no friction rub.   No murmur heard. Pulmonary/Chest: Effort normal and breath sounds normal. He has no wheezes. He has no rales.  Abdominal: Soft. Bowel sounds are normal. He exhibits no distension and no mass. There is no tenderness. There is no rebound and no guarding.  Lymphadenopathy:    He has no cervical adenopathy.  Neurological: He is alert and oriented to person, place, and time. No cranial nerve deficit.  Skin: Skin is warm and dry. No rash noted. He is not diaphoretic.  Psychiatric: He has a normal mood and affect. His behavior is normal.  Nursing note and vitals reviewed.  Results for orders placed or performed in visit on 10/11/14  POCT CBC  Result Value Ref Range   WBC 6.2 4.6 - 10.2 K/uL   Lymph, poc 1.5 0.6 - 3.4   POC LYMPH PERCENT 23.6 10 - 50 %L   MID (cbc) 0.5 0 - 0.9   POC MID % 8.3 0 - 12 %M   POC Granulocyte 4.2 2 - 6.9   Granulocyte percent 68.1 37 - 80 %G   RBC 4.89 4.69 - 6.13 M/uL   Hemoglobin 15.6 14.1 - 18.1 g/dL   HCT, POC 96.0 45.4 - 53.7 %   MCV 92.4 80 - 97 fL   MCH, POC 32.0 (A) 27 - 31.2 pg   MCHC 34.7 31.8 - 35.4 g/dL   RDW, POC 09.8 %   Platelet Count, POC 334 142 - 424 K/uL   MPV 7.6 0 - 99.8 fL       Assessment & Plan:   1. Influenza A   2. Blood pressure elevated    -New. -Pt declined Tamiflu due to previous intolerance with n/v. -supportive care with rest, Tylenol or Ibuprofen, Mucinex DM. -Rx for Tussionex provided -recommend monitoring blood pressure closely at home; decrease salt intake.      No orders of the defined types were placed in this encounter.   Meds ordered this encounter  Medications  . chlorpheniramine-HYDROcodone (TUSSIONEX PENNKINETIC ER) 10-8 MG/5ML SUER    Sig: Take 5 mLs by mouth every 12 (twelve) hours as needed for cough.    Dispense:  180 mL    Refill:  0    GENERIC EQUIVALENT FINE.    No Follow-up on file.    Shoshannah Faubert Paulita Fujita, M.D. [facility] [address] [place], [place]  [address] [phone] phone [phone] fax

## 2015-11-07 NOTE — Patient Instructions (Signed)
1.  Afrin nasal spray twice daily for nasal congestion.  Influenza, Adult Influenza ("the flu") is a viral infection of the respiratory tract. It occurs more often in winter months because people spend more time in close contact with one another. Influenza can make you feel very sick. Influenza easily spreads from person to person (contagious). CAUSES  Influenza is caused by a virus that infects the respiratory tract. You can catch the virus by breathing in droplets from an infected person's cough or sneeze. You can also catch the virus by touching something that was recently contaminated with the virus and then touching your mouth, nose, or eyes. RISKS AND COMPLICATIONS You may be at risk for a more severe case of influenza if you smoke cigarettes, have diabetes, have chronic heart disease (such as heart failure) or lung disease (such as asthma), or if you have a weakened immune system. Elderly people and pregnant women are also at risk for more serious infections. The most common problem of influenza is a lung infection (pneumonia). Sometimes, this problem can require emergency medical care and may be life threatening. SIGNS AND SYMPTOMS  Symptoms typically last 4 to 10 days and may include:  Fever.  Chills.  Headache, body aches, and muscle aches.  Sore throat.  Chest discomfort and cough.  Poor appetite.  Weakness or feeling tired.  Dizziness.  Nausea or vomiting. DIAGNOSIS  Diagnosis of influenza is often made based on your history and a physical exam. A nose or throat swab test can be done to confirm the diagnosis. TREATMENT  In mild cases, influenza goes away on its own. Treatment is directed at relieving symptoms. For more severe cases, your health care provider may prescribe antiviral medicines to shorten the sickness. Antibiotic medicines are not effective because the infection is caused by a virus, not by bacteria. HOME CARE INSTRUCTIONS  Take medicines only as directed  by your health care provider.  Use a cool mist humidifier to make breathing easier.  Get plenty of rest until your temperature returns to normal. This usually takes 3 to 4 days.  Drink enough fluid to keep your urine clear or pale yellow.  Cover yourmouth and nosewhen coughing or sneezing,and wash your handswellto prevent thevirusfrom spreading.  Stay homefromwork orschool untilthe fever is gonefor at least 61full day. PREVENTION  An annual influenza vaccination (flu shot) is the best way to avoid getting influenza. An annual flu shot is now routinely recommended for all adults in the [place] SEEK MEDICAL CARE IF:  You experiencechest pain, yourcough worsens,or you producemore mucus.  Youhave nausea,vomiting, ordiarrhea.  Your fever returns or gets worse. SEEK IMMEDIATE MEDICAL CARE IF:  You havetrouble breathing, you become short of breath,or your skin ornails becomebluish.  You have severe painor stiffnessin the neck.  You develop a sudden headache, or pain in the face or ear.  You have nausea or vomiting that you cannot control. MAKE SURE YOU:   Understand these instructions.  Will watch your condition.  Will get help right away if you are not doing well or get worse.   This information is not intended to replace advice given to you by your health care provider. Make sure you discuss any questions you have with your health care provider.   Document Released: 09/10/2000 Document Revised: 10/04/2014 Document Reviewed: 12/13/2011 Elsevier Interactive Patient Education [employer]

## 2015-12-09 ENCOUNTER — Telehealth: Payer: Self-pay | Admitting: Family Medicine

## 2015-12-09 NOTE — Telephone Encounter (Signed)
Spoke with patient and he declined the flu shot.  He does not take it.  He did state that he was getting over the flu.

## 2016-06-09 ENCOUNTER — Ambulatory Visit: Payer: BC Managed Care – PPO

## 2017-02-04 ENCOUNTER — Ambulatory Visit (INDEPENDENT_AMBULATORY_CARE_PROVIDER_SITE_OTHER): Payer: BC Managed Care – PPO | Admitting: Physician Assistant

## 2017-02-04 ENCOUNTER — Encounter: Payer: Self-pay | Admitting: Physician Assistant

## 2017-02-04 ENCOUNTER — Ambulatory Visit (INDEPENDENT_AMBULATORY_CARE_PROVIDER_SITE_OTHER): Payer: BC Managed Care – PPO

## 2017-02-04 VITALS — BP 157/94 | HR 80 | Temp 98.3°F | Resp 17 | Ht 74.5 in | Wt 184.0 lb

## 2017-02-04 DIAGNOSIS — R202 Paresthesia of skin: Secondary | ICD-10-CM

## 2017-02-04 DIAGNOSIS — I10 Essential (primary) hypertension: Secondary | ICD-10-CM | POA: Diagnosis not present

## 2017-02-04 MED ORDER — PREDNISONE 20 MG PO TABS: ORAL_TABLET | ORAL | 0 refills | Status: AC

## 2017-02-04 NOTE — Progress Notes (Signed)
02/04/2017 11:01 AM   DOB: [birth year] / MRN: [id number]  SUBJECTIVE:  Taylor Morton is a 61 y.o. male presenting for neckpain that has been present for January.  He does associate some numbness since that time.  He has seen orthopedics for this problem.  Tells me arm is not weak per say, however he is aware that his arm does have some mild dysfunction due to the numbness.  He has tried various OTC NSAIDS and took about 3 weeks of daily at roughly medium to high doses.   He has No Known Allergies.   He  has a past medical history of Asthma; Bradycardia; and Chest pain.    He  reports that he has quit smoking. He has never used smokeless tobacco. He reports that he does not drink alcohol or use drugs. He  reports that he currently engages in sexual activity. The patient  has a past surgical history that includes Appendectomy.  His family history includes Arthritis in his mother; Cancer in his father.  Review of Systems  Constitutional: Negative for chills and fever.  Skin: Negative for rash.  Neurological: Positive for tingling and sensory change. Negative for dizziness, tremors, focal weakness, seizures, loss of consciousness and headaches.    The problem list and medications were reviewed and updated by myself where necessary and exist elsewhere in the encounter.   OBJECTIVE:  BP (!) 157/94   Pulse 80   Temp 98.3 F (36.8 C) (Oral)   Resp 17   Ht 6' 2.5" (1.892 m)   Wt 184 lb (83.5 kg)   SpO2 97%   BMI 23.31 kg/m   Physical Exam  Constitutional: He is oriented to person, place, and time. He appears well-developed. He is active and cooperative.  Non-toxic appearance.  Eyes: EOM are normal. Pupils are equal, round, and reactive to light.  Cardiovascular: Normal rate, S1 normal, S2 normal and normal pulses.  Exam reveals no gallop and no friction rub.   No murmur heard. Pulmonary/Chest: Effort normal. No tachypnea. He has no rales.  Abdominal: He exhibits no distension.    Musculoskeletal: Normal range of motion. He exhibits no edema, tenderness or deformity.       Right shoulder: He exhibits normal range of motion and no tenderness.       Cervical back: He exhibits normal range of motion, no tenderness, no bony tenderness and no swelling.       Arms: Neurological: He is alert and oriented to person, place, and time. He has normal strength and normal reflexes. He is not disoriented. No cranial nerve deficit or sensory deficit. He exhibits normal muscle tone. Coordination and gait normal.  Skin: Skin is warm and dry. He is not diaphoretic. No pallor.  Psychiatric: His behavior is normal.  Vitals reviewed.   BP Readings from Last 3 Encounters:  02/04/17 (!) 157/94  11/07/15 (!) 155/84  10/20/15 (!) 160/90     No results found for this or any previous visit (from the past 72 hour(s)).  Dg Cervical Spine 2 Or 3 Views  Result Date: 02/04/2017 CLINICAL DATA:  Cervicalgia EXAM: CERVICAL SPINE - 2-3 VIEW COMPARISON:  None. FINDINGS: Frontal, lateral, and open-mouth odontoid images were obtained. There is no fracture or spondylolisthesis. Prevertebral soft tissues and predental space regions are normal. There is moderate disc space narrowing at C3-4, C4-5, C5-6, and C6-7. There are small anterior osteophytes at C3, C4, C5, and C6. No erosive change. IMPRESSION: Osteoarthritic change at several levels. No fracture or  spondylolisthesis. Electronically Signed   By: Bretta BangWilliam  Woodruff III M.D.   On: 02/04/2017 10:36    ASSESSMENT AND PLAN:  [name] was seen today for neck pain and shoulder pain.  Diagnoses and all orders for this visit:  Paresthesia of right upper extremity: Pred and PT for now.  I think he will probably need an MRI eventually.  Hopefully he will have some improvement with pred and PT.  He will come back as needed.   -     DG Cervical Spine 2 or 3 views; Future -     predniSONE (DELTASONE) 20 MG tablet; Take 3 in the morning for 3 days, then 2 in the  morning for 3 days, and then 1 in the morning for 3 days. -     Ambulatory referral to Physical Therapy  Essential hypertension: I have advised that he start an antihypertensive. He tells me that he will stop eating salt.  Advised that he monitor his BP and check in with me in the next month.     The patient is advised to call or return to clinic if he does not see an improvement in symptoms, or to seek the care of the closest emergency department if he worsens with the above plan.   Deliah BostonMichael Nicco Reaume, MHS, PA-C [facility] 02/04/2017 11:01 AM

## 2017-02-04 NOTE — Patient Instructions (Signed)
     IF you received an x-ray today, you will receive an invoice from [facility]. Please contact [facility] at [phone] with questions or concerns regarding your invoice.   IF you received labwork today, you will receive an invoice from [facility]. Please contact [facility] at [phone] with questions or concerns regarding your invoice.   Our billing staff will not be able to assist you with questions regarding bills from these companies.  You will be contacted with the lab results as soon as they are available. The fastest way to get your results is to activate your My Chart account. Instructions are located on the last page of this paperwork. If you have not heard from us regarding the results in 2 weeks, please contact this office.     

## 2017-02-08 ENCOUNTER — Ambulatory Visit (INDEPENDENT_AMBULATORY_CARE_PROVIDER_SITE_OTHER): Payer: BC Managed Care – PPO

## 2017-02-08 ENCOUNTER — Ambulatory Visit: Payer: BC Managed Care – PPO | Admitting: Physical Therapy

## 2017-02-08 ENCOUNTER — Encounter: Payer: Self-pay | Admitting: Physician Assistant

## 2017-02-08 ENCOUNTER — Ambulatory Visit (INDEPENDENT_AMBULATORY_CARE_PROVIDER_SITE_OTHER): Payer: BC Managed Care – PPO | Admitting: Physician Assistant

## 2017-02-08 VITALS — BP 130/72 | HR 55 | Temp 97.8°F | Resp 17 | Ht 74.5 in | Wt 178.0 lb

## 2017-02-08 DIAGNOSIS — Z801 Family history of malignant neoplasm of trachea, bronchus and lung: Secondary | ICD-10-CM

## 2017-02-08 DIAGNOSIS — R042 Hemoptysis: Secondary | ICD-10-CM

## 2017-02-08 DIAGNOSIS — Z87891 Personal history of nicotine dependence: Secondary | ICD-10-CM | POA: Diagnosis not present

## 2017-02-08 LAB — POCT CBC
Granulocyte percent: 77.9 %G (ref 37–80)
HEMATOCRIT: 47.4 % (ref 43.5–53.7)
HEMOGLOBIN: 16.3 g/dL (ref 14.1–18.1)
LYMPH, POC: 1.6 (ref 0.6–3.4)
MCH: 31.7 pg — AB (ref 27–31.2)
MCHC: 34.5 g/dL (ref 31.8–35.4)
MCV: 92 fL (ref 80–97)
MID (cbc): 0.2 (ref 0–0.9)
MPV: 7.9 fL (ref 0–99.8)
POC GRANULOCYTE: 6.3 (ref 2–6.9)
POC LYMPH PERCENT: 19.2 %L (ref 10–50)
POC MID %: 2.9 % (ref 0–12)
Platelet Count, POC: 295 10*3/uL (ref 142–424)
RBC: 5.15 M/uL (ref 4.69–6.13)
RDW, POC: 12.5 %
WBC: 8.1 10*3/uL (ref 4.6–10.2)

## 2017-02-08 NOTE — Progress Notes (Signed)
02/08/2017 3:00 PM   DOB: 10-29-1955 / MRN: [id number]  SUBJECTIVE:  Taylor BasemanJames Marchesi is a 61 y.o. male presenting for bright red blood that started about 2 weeks ago and up to three times since that time. This only occurs in the morning.  This is blood speckled and mostly bright red.  He does associate some posterior chest pain that is worse with cough. Denies any URI in the last month. Denies SOB and was able to exercise.  Denies leg pain. Reports a fifteen pack year history. Father died with lung cancer at age 62.   Denies heartburn, no binge drinking, emesis. He denies a history of cirrhosis.   He is getting his BP checked at a gorcery store and this was running around 120/70.    He has No Known Allergies.   He  has a past medical history of Asthma; Bradycardia; and Chest pain.    He  reports that he has quit smoking. He has never used smokeless tobacco. He reports that he does not drink alcohol or use drugs. He  reports that he currently engages in sexual activity. The patient  has a past surgical history that includes Appendectomy.  His family history includes Arthritis in his mother; Cancer in his father.  Review of Systems  Constitutional: Negative for fever.  Respiratory: Positive for cough, hemoptysis and sputum production. Negative for shortness of breath and wheezing.   Cardiovascular: Negative for chest pain, palpitations, claudication, leg swelling and PND.  Neurological: Negative for dizziness.    The problem list and medications were reviewed and updated by myself where necessary and exist elsewhere in the encounter.   OBJECTIVE:  BP 130/72 (BP Location: Left Arm, Patient Position: Sitting, Cuff Size: Normal)   Pulse (!) 55   Temp 97.8 F (36.6 C) (Oral)   Resp 17   Ht 6' 2.5" (1.892 m)   Wt 178 lb (80.7 kg)   SpO2 99%   BMI 22.55 kg/m   Physical Exam  Constitutional: He appears well-developed. He is active and cooperative.  Non-toxic appearance.  HENT:  Right  Ear: Hearing, tympanic membrane, external ear and ear canal normal.  Left Ear: Hearing, tympanic membrane, external ear and ear canal normal.  Nose: Nose normal. Right sinus exhibits no maxillary sinus tenderness and no frontal sinus tenderness. Left sinus exhibits no maxillary sinus tenderness and no frontal sinus tenderness.  Mouth/Throat: Uvula is midline, oropharynx is clear and moist and mucous membranes are normal. No oropharyngeal exudate, posterior oropharyngeal edema or tonsillar abscesses.  Eyes: Conjunctivae are normal. Pupils are equal, round, and reactive to light.  Cardiovascular: Normal rate, regular rhythm, S1 normal, S2 normal, normal heart sounds, intact distal pulses and normal pulses.  Exam reveals no gallop and no friction rub.   No murmur heard. Pulmonary/Chest: Effort normal. No stridor. No tachypnea. No respiratory distress. He has no wheezes. He has no rales.  Abdominal: He exhibits no distension.  Musculoskeletal: He exhibits no edema.  Lymphadenopathy:       Head (right side): No submandibular and no tonsillar adenopathy present.       Head (left side): No submandibular and no tonsillar adenopathy present.    He has no cervical adenopathy.  Neurological: He is alert.  Skin: Skin is warm and dry. He is not diaphoretic. No pallor.  Vitals reviewed.    Results for orders placed or performed in visit on 02/08/17 (from the past 72 hour(s))  POCT CBC     Status: Abnormal  Collection Time: 02/08/17  2:29 PM  Result Value Ref Range   WBC 8.1 4.6 - 10.2 K/uL   Lymph, poc 1.6 0.6 - 3.4   POC LYMPH PERCENT 19.2 10 - 50 %L   MID (cbc) 0.2 0 - 0.9   POC MID % 2.9 0 - 12 %M   POC Granulocyte 6.3 2 - 6.9   Granulocyte percent 77.9 37 - 80 %G   RBC 5.15 4.69 - 6.13 M/uL   Hemoglobin 16.3 14.1 - 18.1 g/dL   HCT, POC 16.1 09.6 - 53.7 %   MCV 92.0 80 - 97 fL   MCH, POC 31.7 (A) 27 - 31.2 pg   MCHC 34.5 31.8 - 35.4 g/dL   RDW, POC 04.5 %   Platelet Count, POC 295 142 -  424 K/uL   MPV 7.9 0 - 99.8 fL    Dg Chest 2 View  Result Date: 02/08/2017 CLINICAL DATA:  Mild Hemoptysis, 15 pack year history quit 30 years ago, no SOB, DOE. EXAM: CHEST  2 VIEW COMPARISON:  Chest x-ray dated 10/20/2015. FINDINGS: The heart size and mediastinal contours are within normal limits. Both lungs are clear. Mild levoscoliosis of the thoracolumbar spine. Mild degenerative spurring within the thoracic spine. No acute or suspicious osseous finding. IMPRESSION: No active cardiopulmonary disease. No evidence of pneumonia or pulmonary edema. Electronically Signed   By: Bary Richard M.D.   On: 02/08/2017 14:34    ASSESSMENT AND PLAN:  Jenny was seen today for cough.  Diagnoses and all orders for this visit:  Hemoptysis: He is very concerned.  My work up is reassuring buyt does not exclude a cancer diagnosis.  Liver disease unlikely. No gerd. Fromer smoker.  No gum bleeding or nose bleeding to explain hemoptysis otherwise. Will order CT without.  -     POCT CBC -     Hepatic function panel -     DG Chest 2 View; Future -     CT CHEST WO CONTRAST; Future  Family history of lung cancer  Stopped smoking with greater than 15 pack year history    The patient is advised to call or return to clinic if he does not see an improvement in symptoms, or to seek the care of the closest emergency department if he worsens with the above plan.   [name], MHS, PA-C [facility] 02/08/2017 3:00 PM

## 2017-02-08 NOTE — Patient Instructions (Signed)
     IF you received an x-ray today, you will receive an invoice from [facility]. Please contact [facility] at [phone] with questions or concerns regarding your invoice.   IF you received labwork today, you will receive an invoice from [facility]. Please contact [facility] at [phone] with questions or concerns regarding your invoice.   Our billing staff will not be able to assist you with questions regarding bills from these companies.  You will be contacted with the lab results as soon as they are available. The fastest way to get your results is to activate your My Chart account. Instructions are located on the last page of this paperwork. If you have not heard from us regarding the results in 2 weeks, please contact this office.     

## 2017-02-09 LAB — HEPATIC FUNCTION PANEL
ALBUMIN: 5.1 g/dL — AB (ref 3.6–4.8)
ALT: 16 IU/L (ref 0–44)
AST: 26 IU/L (ref 0–40)
Alkaline Phosphatase: 71 IU/L (ref 39–117)
Bilirubin Total: 2 mg/dL — ABNORMAL HIGH (ref 0.0–1.2)
Bilirubin, Direct: 0.4 mg/dL (ref 0.00–0.40)
TOTAL PROTEIN: 7.3 g/dL (ref 6.0–8.5)

## 2017-02-10 NOTE — Progress Notes (Signed)
Please add on a fractionated billi. Deliah BostonMichael Aarushi Hemric, MS, PA-C 12:44 PM, 02/10/2017

## 2017-02-13 LAB — SPECIMEN STATUS REPORT

## 2017-02-13 LAB — BILIRUBIN, FRACTIONATED(TOT/DIR/INDIR)
BILIRUBIN TOTAL: 1.7 mg/dL — AB (ref 0.0–1.2)
Bilirubin, Direct: 0.34 mg/dL (ref 0.00–0.40)
Bilirubin, Indirect: 1.36 mg/dL — ABNORMAL HIGH (ref 0.10–0.80)

## 2017-02-14 NOTE — Progress Notes (Signed)
Please add on future hepatic function panel for recheck in three months lab only. Scheduling please call him and advise that he come back in 3 months for recheck. Deliah BostonMichael Draper Gallon, MS, PA-C 10:10 AM, 02/14/2017 ]

## 2017-02-15 ENCOUNTER — Other Ambulatory Visit: Payer: Self-pay | Admitting: *Deleted

## 2017-02-15 DIAGNOSIS — R042 Hemoptysis: Secondary | ICD-10-CM

## 2017-02-22 ENCOUNTER — Ambulatory Visit
Admission: RE | Admit: 2017-02-22 | Discharge: 2017-02-22 | Disposition: A | Discharge status: Home / Self Care | Payer: BC Managed Care – PPO | Source: Ambulatory Visit | Attending: Physician Assistant | Admitting: Physician Assistant

## 2017-02-22 DIAGNOSIS — R042 Hemoptysis: Secondary | ICD-10-CM

## 2017-02-28 ENCOUNTER — Ambulatory Visit (INDEPENDENT_AMBULATORY_CARE_PROVIDER_SITE_OTHER): Payer: BC Managed Care – PPO | Admitting: Physician Assistant

## 2017-02-28 ENCOUNTER — Encounter: Payer: Self-pay | Admitting: Physician Assistant

## 2017-02-28 VITALS — BP 149/88 | HR 76 | Temp 98.2°F | Resp 18 | Ht 73.0 in | Wt 179.0 lb

## 2017-02-28 DIAGNOSIS — I2584 Coronary atherosclerosis due to calcified coronary lesion: Secondary | ICD-10-CM

## 2017-02-28 DIAGNOSIS — I2510 Atherosclerotic heart disease of native coronary artery without angina pectoris: Secondary | ICD-10-CM | POA: Diagnosis not present

## 2017-02-28 MED ORDER — ASPIRIN EC 81 MG PO TBEC: 81.0000 mg | DELAYED_RELEASE_TABLET | Freq: Every day | ORAL | 0 refills | Status: DC

## 2017-02-28 NOTE — Progress Notes (Signed)
  02/28/2017 8:33 AM   DOB: 08/09/1956 / MRN: [id number]  SUBJECTIVE:  Taylor BasemanJames Morton is a 61 y.o. male presenting for discussion of his CT.  He is a non diabetic.  History of elevated pressures here.  No lipids or A1c on file. Most recent EKG in 2014.  Dads brother died of a heart attack at an early age, however patient's parents are both healthy. He did smoke 16 pack year history quit at age 61.   He has No Known Allergies.   He  has a past medical history of Asthma; Bradycardia; and Chest pain.    He  reports that he has quit smoking. He has never used smokeless tobacco. He reports that he does not drink alcohol or use drugs. He  reports that he currently engages in sexual activity. The patient  has a past surgical history that includes Appendectomy.  His family history includes Arthritis in his mother; Cancer in his father.  Review of Systems  Respiratory: Negative for shortness of breath.   Cardiovascular: Negative for chest pain, orthopnea and leg swelling.  Neurological: Negative for dizziness.    The problem list and medications were reviewed and updated by myself where necessary and exist elsewhere in the encounter.   OBJECTIVE:  BP (!) 149/88 (BP Location: Right Arm, Patient Position: Sitting, Cuff Size: Normal)   Pulse 76   Temp 98.2 F (36.8 C) (Oral)   Resp 18   Ht 6\' 1"  (1.854 m)   Wt 179 lb (81.2 kg)   SpO2 98%   BMI 23.62 kg/m   Physical Exam  Constitutional: He is oriented to person, place, and time.  Cardiovascular: Normal rate, regular rhythm and normal heart sounds.  Exam reveals no gallop and no friction rub.   No murmur heard. Pulmonary/Chest: Effort normal and breath sounds normal.  Musculoskeletal: Normal range of motion.  Neurological: He is alert and oriented to person, place, and time.      No results found for this or any previous visit (from the past 72 hour(s)).  No results found.  ASSESSMENT AND PLAN:  Taylor FearingJames was seen today for ct scan and  follow-up.  Diagnoses and all orders for this visit:  Coronary artery calcification: FOund incidentally on CT scan. Patient with history of HTN here and somewhat resistant to treatment advise. Fortunately he is fairly thin and no immediate family members with CAD.  He is willing to start an ASA today and denies a history of GI bleed. I will formulate his risk per ASCVD.  He will certainly benefit from BP treatment. I have advised him of this and he is not ready. I will likely refer him to cardiology -     EKG; Future -     Lipid panel -     Hemoglobin A1c -     High sensitivity CRP -     aspirin EC 81 MG tablet; Take 1 tablet (81 mg total) by mouth daily.    The patient is advised to call or return to clinic if he does not see an improvement in symptoms, or to seek the care of the closest emergency department if he worsens with the above plan.   Taylor BostonMichael Aneth Schlagel, MHS, PA-C [facility] 02/28/2017 8:33 AM

## 2017-02-28 NOTE — Patient Instructions (Addendum)
  Exercise improves every system in the body.  It lowers the risk of heart disease, decreases blood pressure, reduces the symptoms of depression and anxiety, and lowers blood sugar. To receive these benefits, try to get 150 minutes of planned exercise each week.  You can break this 150 minutes up however you like.  For instance, you can perform 30 minutes of brisk walking 5 days a week, or perform 50 minutes 3 days a week.  If you don't like walking, or can't find a safe place to walk, find another way to move that you can enjoy.  Exercise tapes, cycling, stair climbing, swimming, or a combination will be just as good as a walking program. To ensure the proper intensity, you can use the talk test. Essentially, you should be able to carry on a conversation, but you should have to take short breaks from the conversation in order catch your breath.   The risk factors for heart disease are smoking, diabetes, hypertension, cholesterol, age, and the male gender.  The risk we can control are diabetes, HTN, cholesterol.  These are targets to reduce if you have increased risk of coronary artery disease.     IF you received an x-ray today, you will receive an [facility]. Please contact [facility] at [phone] with questions or concerns regarding your invoice.   IF you received labwork today, you will receive an invoice from [facility]. Please contact [facility] at [phone] with questions or concerns regarding your invoice.   Our billing staff will not be able to assist you with questions regarding bills from these companies.  You will be contacted with the lab results as soon as they are available. The fastest way to get your results is to activate your My Chart account. Instructions are located on the last page of this paperwork. If you have not heard from us regarding the results in 2 weeks, please contact this office.

## 2017-02-28 NOTE — Addendum Note (Signed)
Addended by: Lendell CapriceHRISTOPHER, Joy Reiger M on: 02/28/2017 08:41 AM   Modules accepted: Orders

## 2017-03-01 LAB — HIGH SENSITIVITY CRP: CRP, High Sensitivity: 1.06 mg/L (ref 0.00–3.00)

## 2017-03-01 LAB — LIPID PANEL
CHOL/HDL RATIO: 3 ratio (ref 0.0–5.0)
Cholesterol, Total: 177 mg/dL (ref 100–199)
HDL: 60 mg/dL (ref >39)
LDL Calculated: 104 mg/dL — ABNORMAL HIGH (ref 0–99)
Triglycerides: 67 mg/dL (ref 0–149)
VLDL Cholesterol Cal: 13 mg/dL (ref 5–40)

## 2017-03-01 LAB — HEMOGLOBIN A1C
Est. average glucose Bld gHb Est-mCnc: 103 mg/dL
HEMOGLOBIN A1C: 5.2 % (ref 4.8–5.6)

## 2017-03-03 ENCOUNTER — Other Ambulatory Visit: Payer: Self-pay | Admitting: Physician Assistant

## 2017-03-03 ENCOUNTER — Telehealth: Payer: Self-pay | Admitting: Physician Assistant

## 2017-03-03 MED ORDER — ATORVASTATIN CALCIUM 20 MG PO TABS: 20.0000 mg | ORAL_TABLET | Freq: Every day | ORAL | 3 refills | Status: DC

## 2017-03-03 MED ORDER — LISINOPRIL 10 MG PO TABS: 10.0000 mg | ORAL_TABLET | Freq: Every day | ORAL | 3 refills | Status: DC

## 2017-03-03 NOTE — Telephone Encounter (Signed)
PATIENT STATES Taylor Morton TRIED TO CALL HIM BUT DID NOT LEAVE A MESSAGE. HE IS RETURNING THE CALL. BEST PHONE [phone] (CELL) MBC

## 2017-03-03 NOTE — Telephone Encounter (Signed)
Clark advised

## 2017-03-03 NOTE — Progress Notes (Signed)
Given pateint elevated risk of CAD per ASCVD calculator start both a low dose cholesterol and blood pressure medication.  Please call him and advise.  The other option we have is to let him see Dr. Nadara EatonGangi in Cardiology for further risk stratification. Deliah BostonMichael Demar Shad, MS, PA-C 1:37 PM, 03/03/2017

## 2017-05-16 ENCOUNTER — Other Ambulatory Visit: Payer: BC Managed Care – PPO | Admitting: Physician Assistant

## 2017-11-03 ENCOUNTER — Ambulatory Visit: Payer: BC Managed Care – PPO | Admitting: Family Medicine

## 2017-11-03 ENCOUNTER — Other Ambulatory Visit: Payer: Self-pay

## 2017-11-03 ENCOUNTER — Encounter: Payer: Self-pay | Admitting: Family Medicine

## 2017-11-03 VITALS — BP 138/82 | HR 67 | Temp 98.0°F | Resp 16 | Ht 73.0 in | Wt 177.2 lb

## 2017-11-03 DIAGNOSIS — R0789 Other chest pain: Secondary | ICD-10-CM

## 2017-11-03 DIAGNOSIS — Z87898 Personal history of other specified conditions: Secondary | ICD-10-CM

## 2017-11-03 DIAGNOSIS — R05 Cough: Secondary | ICD-10-CM | POA: Diagnosis not present

## 2017-11-03 DIAGNOSIS — R059 Cough, unspecified: Secondary | ICD-10-CM

## 2017-11-03 MED ORDER — ALBUTEROL SULFATE HFA 108 (90 BASE) MCG/ACT IN AERS: 1.0000 | INHALATION_SPRAY | Freq: Four times a day (QID) | RESPIRATORY_TRACT | 0 refills | Status: DC | PRN

## 2017-11-03 MED ORDER — BENZONATATE 100 MG PO CAPS: 100.0000 mg | ORAL_CAPSULE | Freq: Three times a day (TID) | ORAL | 0 refills | Status: DC | PRN
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
## 2017-11-03 NOTE — Patient Instructions (Addendum)
Try flonase or over the counter antihistamine such as claritin or allegra, saline nasal spray and tessalon perles if needed up to every 8 hours as needed.   If any wheezing - you can use albuterol inhaler.  If you require that medicine more than once or twice per day, or persistently require that medicine for more than 2-3 days, return to discuss other medications.  If cough is not improving in the next 2 weeks, or worsening sooner, return for possible x-ray.  Please follow-up to discuss neck symptoms further and we can decide if x-rays needed.  Soreness on chest wall is likely from coughing. Tylenol can help that pain. Please return if any worsening pain.   Return to the clinic or go to the nearest emergency room if any of your symptoms worsen or new symptoms occur.   Chest Wall Pain Chest wall pain is pain in or around the bones and muscles of your chest. Sometimes, an injury causes this pain. Sometimes, the cause may not be known. This pain may take several weeks or longer to get better. Follow these instructions at home: Pay attention to any changes in your symptoms. Take these actions to help with your pain:  Rest as told by your health care provider.  Avoid activities that cause pain. These include any activities that use your chest muscles or your abdominal and side muscles to lift heavy items.  If directed, apply ice to the painful area: ? Put ice in a plastic bag. ? Place a towel between your skin and the bag. ? Leave the ice on for 20 minutes, 2-3 times per day.  Take over-the-counter and prescription medicines only as told by your health care provider.  Do not use tobacco products, including cigarettes, chewing tobacco, and e-cigarettes. If you need help quitting, ask your health care provider.  Keep all follow-up visits as told by your health care provider. This is important.  Contact a health care provider if:  You have a fever.  Your chest pain becomes worse.  You  have new symptoms. Get help right away if:  You have nausea or vomiting.  You feel sweaty or light-headed.  You have a cough with phlegm (sputum) or you cough up blood.  You develop shortness of breath. This information is not intended to replace advice given to you by your health care provider. Make sure you discuss any questions you have with your health care provider. Document Released: 09/13/2005 Document Revised: 01/22/2016 Document Reviewed: 12/09/2014 Elsevier Interactive Patient Education  2018 Elsevier Inc.  Cough, Adult Coughing is a reflex that clears your throat and your airways. Coughing helps to heal and protect your lungs. It is normal to cough occasionally, but a cough that happens with other symptoms or lasts a long time may be a sign of a condition that needs treatment. A cough may last only 2-3 weeks (acute), or it may last longer than 8 weeks (chronic). What are the causes? Coughing is commonly caused by:  Breathing in substances that irritate your lungs.  A viral or bacterial respiratory infection.  Allergies.  Asthma.  Postnasal drip.  Smoking.  Acid backing up from the stomach into the esophagus (gastroesophageal reflux).  Certain medicines.  Chronic lung problems, including COPD (or rarely, lung cancer).  Other medical conditions such as heart failure.  Follow these instructions at home: Pay attention to any changes in your symptoms. Take these actions to help with your discomfort:  Take medicines only as told by your  health care provider. ? If you were prescribed an antibiotic medicine, take it as told by your health care provider. Do not stop taking the antibiotic even if you start to feel better. ? Talk with your health care provider before you take a cough suppressant medicine.  Drink enough fluid to keep your urine clear or pale yellow.  If the air is dry, use a cold steam vaporizer or humidifier in your bedroom or your home to help loosen  secretions.  Avoid anything that causes you to cough at work or at home.  If your cough is worse at night, try sleeping in a semi-upright position.  Avoid cigarette smoke. If you smoke, quit smoking. If you need help quitting, ask your health care provider.  Avoid caffeine.  Avoid alcohol.  Rest as needed.  Contact a health care provider if:  You have new symptoms.  You cough up pus.  Your cough does not get better after 2-3 weeks, or your cough gets worse.  You cannot control your cough with suppressant medicines and you are losing sleep.  You develop pain that is getting worse or pain that is not controlled with pain medicines.  You have a fever.  You have unexplained weight loss.  You have night sweats. Get help right away if:  You cough up blood.  You have difficulty breathing.  Your heartbeat is very fast. This information is not intended to replace advice given to you by your health care provider. Make sure you discuss any questions you have with your health care provider. Document Released: 03/12/2011 Document Revised: 02/19/2016 Document Reviewed: 11/20/2014 Elsevier Interactive Patient Education  2018 [employer]     IF you received an x-ray today, you will receive an invoice from [facility]. Please contact [facility] at [phone] with questions or concerns regarding your invoice.   IF you received labwork today, you will receive an invoice from [facility]. Please contact [facility] at [phone] with questions or concerns regarding your invoice.   Our billing staff will not be able to assist you with questions regarding bills from these companies.  You will be contacted with the lab results as soon as they are available. The fastest way to get your results is to activate your My Chart account. Instructions are located on the last page of this paperwork. If you have not heard from us regarding the results in 2 weeks, please contact  this office.

## 2018-03-17 ENCOUNTER — Ambulatory Visit: Payer: BC Managed Care – PPO | Admitting: Emergency Medicine

## 2018-03-17 ENCOUNTER — Encounter: Payer: Self-pay | Admitting: Emergency Medicine

## 2018-03-17 ENCOUNTER — Ambulatory Visit (INDEPENDENT_AMBULATORY_CARE_PROVIDER_SITE_OTHER): Payer: BC Managed Care – PPO

## 2018-03-17 ENCOUNTER — Other Ambulatory Visit: Payer: Self-pay

## 2018-03-17 VITALS — BP 128/72 | HR 70 | Temp 98.5°F | Resp 16 | Ht 73.0 in | Wt 164.2 lb

## 2018-03-17 DIAGNOSIS — R05 Cough: Secondary | ICD-10-CM | POA: Insufficient documentation

## 2018-03-17 DIAGNOSIS — R051 Acute cough: Secondary | ICD-10-CM | POA: Insufficient documentation

## 2018-03-17 DIAGNOSIS — J029 Acute pharyngitis, unspecified: Secondary | ICD-10-CM | POA: Diagnosis not present

## 2018-03-17 DIAGNOSIS — R059 Cough, unspecified: Secondary | ICD-10-CM

## 2018-03-17 DIAGNOSIS — J22 Unspecified acute lower respiratory infection: Secondary | ICD-10-CM | POA: Diagnosis not present

## 2018-03-17 MED ORDER — AZITHROMYCIN 250 MG PO TABS: ORAL_TABLET | ORAL | 0 refills | Status: DC

## 2018-03-17 NOTE — Progress Notes (Signed)
Taylor Morton 62 y.o.   Chief Complaint  Patient presents with  . Cough    x 3 days  . Sore Throat    HISTORY OF PRESENT ILLNESS: This is a 62 y.o. male complaining of cough and sore throat x 3 days.  Cough  This is a new problem. The current episode started in the past 7 days. The problem has been waxing and waning. The problem occurs every few minutes. The cough is productive of sputum. Associated symptoms include a sore throat. Pertinent negatives include no chest pain, chills, ear congestion, ear pain, eye redness, fever, headaches, heartburn, hemoptysis, myalgias, nasal congestion, rash, rhinorrhea, shortness of breath, sweats or wheezing. Nothing aggravates the symptoms. Risk factors for lung disease include smoking/tobacco exposure. He has tried nothing for the symptoms. There is no history of asthma, COPD or emphysema.     Prior to Admission medications   Medication Sig Start Date End Date Taking? Authorizing Provider  albuterol (PROVENTIL HFA;VENTOLIN HFA) 108 (90 Base) MCG/ACT inhaler Inhale 1-2 puffs into the lungs every 6 (six) hours as needed for wheezing or shortness of breath. 11/03/17  Yes Shade Flood, MD  aspirin EC 81 MG tablet Take 1 tablet (81 mg total) by mouth daily. Patient not taking: Reported on 03/17/2018 02/28/17   Ofilia Neas, PA-C  atorvastatin (LIPITOR) 20 MG tablet Take 1 tablet (20 mg total) by mouth daily. Patient not taking: Reported on 03/17/2018 03/03/17   Ofilia Neas, PA-C  benzonatate (TESSALON) 100 MG capsule Take 1 capsule (100 mg total) by mouth 3 (three) times daily as needed for cough. Patient not taking: Reported on 03/17/2018 11/03/17   Shade Flood, MD  lisinopril (PRINIVIL,ZESTRIL) 10 MG tablet Take 1 tablet (10 mg total) by mouth daily. Patient not taking: Reported on 03/17/2018 03/03/17   Ofilia Neas, PA-C    No Known Allergies  Patient Active Problem List   Diagnosis Date Noted  . Coronary artery calcification 02/28/2017    . Asthma     Past Medical History:  Diagnosis Date  . Asthma   . Bradycardia   . Chest pain     Past Surgical History:  Procedure Laterality Date  . APPENDECTOMY      Social History   Socioeconomic History  . Marital status: Married    Spouse name: Annabelle Harman  . Number of children: 2  . Years of education: 56  . Highest education level: Not on file  Occupational History  . 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[employer]    Comment: [employer]  Social Needs  . Financial resource strain: Not on file  . Food insecurity:    Worry: Not on file    Inability: Not on file  . Transportation needs:    Medical: Not on file    Non-medical: Not on file  Tobacco Use  . Smoking status: Former Games developer  . Smokeless tobacco: Never Used  Substance and Sexual Activity  . Alcohol use: No  . Drug use: No  . Sexual activity: Yes  Lifestyle  . Physical activity:    Days per week: Not on file    Minutes per session: Not on file  . Stress: Not on file  Relationships  . Social connections:    Talks on phone: Not on file    Gets together: Not on file    Attends religious service: Not on file    Active member of club or  organization: Not on file    Attends meetings of clubs or organizations: Not on file    Relationship status: Not on file  . Intimate partner violence:    Fear of current or ex partner: Not on file    Emotionally abused: Not on file    Physically abused: Not on file    Forced sexual activity: Not on file  Other Topics Concern  . Not on file  Social History Narrative   Lives with his wife.  Sons are both in college.    Family History  Problem Relation Age of Onset  . Cancer Father   . Arthritis Mother      Review of Systems  Constitutional: Negative for chills and fever.  HENT: Positive for congestion and sore throat. Negative for ear pain, nosebleeds and rhinorrhea.   Eyes: Negative.  Negative for discharge and  redness.  Respiratory: Positive for cough and sputum production. Negative for hemoptysis, shortness of breath and wheezing.   Cardiovascular: Negative.  Negative for chest pain, palpitations and leg swelling.  Gastrointestinal: Negative.  Negative for abdominal pain, blood in stool, diarrhea, heartburn, nausea and vomiting.  Genitourinary: Negative.  Negative for dysuria and hematuria.  Musculoskeletal: Negative for back pain, myalgias and neck pain.  Skin: Negative for rash.  Neurological: Negative.  Negative for dizziness and headaches.  Endo/Heme/Allergies: Negative.   All other systems reviewed and are negative.   Vitals:   03/17/18 1102  BP: 128/72  Pulse: 70  Resp: 16  Temp: 98.5 F (36.9 C)  SpO2: 98%    Physical Exam  Constitutional: He is oriented to person, place, and time. He appears well-developed and well-nourished.  HENT:  Head: Normocephalic and atraumatic.  Nose: Nose normal.  Mouth/Throat: Uvula is midline. No uvula swelling. Posterior oropharyngeal erythema present. No oropharyngeal exudate or tonsillar abscesses.  Eyes: Pupils are equal, round, and reactive to light. Conjunctivae and EOM are normal.  Neck: Normal range of motion. Neck supple. No JVD present. No thyromegaly present.  Cardiovascular: Normal rate, regular rhythm and normal heart sounds.  Pulmonary/Chest: Effort normal and breath sounds normal.  Abdominal: Soft. Bowel sounds are normal. He exhibits no distension. There is no tenderness.  Musculoskeletal: Normal range of motion.  Lymphadenopathy:    He has no cervical adenopathy.  Neurological: He is alert and oriented to person, place, and time. No sensory deficit. He exhibits normal muscle tone.  Skin: Skin is warm and dry. Capillary refill takes less than 2 seconds. No rash noted.  Psychiatric: He has a normal mood and affect. His behavior is normal.  Vitals reviewed.  Dg Chest 2 View  Result Date: 03/17/2018 CLINICAL DATA:  Cough EXAM:  CHEST - 2 VIEW COMPARISON:  Chest x-rays dated 02/08/2017 10/11/2014. FINDINGS: Heart size and mediastinal contours are within normal limits. Lungs are clear. No pleural effusion or pneumothorax. No acute or suspicious osseous finding. IMPRESSION: No active cardiopulmonary disease.  No evidence of pneumonia. Electronically Signed   By: Bary RichardStan  Maynard M.D.   On: 03/17/2018 11:33     ASSESSMENT & PLAN: [name] was seen today for cough and sore throat.  Diagnoses and all orders for this visit:  Cough -     DG Chest 2 View; Future  Sore throat  Lower resp. tract infection -     azithromycin (ZITHROMAX) 250 MG tablet; Sig as indicated    Patient Instructions       IF you received an x-ray today, you will receive an invoice  from [facility]. Please contact [facility] at [phone] with questions or concerns regarding your invoice.   IF you received labwork today, you will receive an invoice from [place]. Please contact [facility] at [phone] with questions or concerns regarding your invoice.   Our billing staff will not be able to assist you with questions regarding bills from these companies.  You will be contacted with the lab results as soon as they are available. The fastest way to get your results is to activate your My Chart account. Instructions are located on the last page of this paperwork. If you have not heard from [place] regarding the results in 2 weeks, please contact this office.     Cough, Adult A cough helps to clear your throat and lungs. A cough may last only 2-3 weeks (acute), or it may last longer than 8 weeks (chronic). Many different things can cause a cough. A cough may be a sign of an illness or another medical condition. Follow these instructions at home:  Pay attention to any changes in your cough.  Take medicines only as told by your doctor. ? If you were prescribed an antibiotic medicine, take it as told by your doctor. Do not stop  taking it even if you start to feel better. ? Talk with your doctor before you try using a cough medicine.  Drink enough fluid to keep your pee (urine) clear or pale yellow.  If the air is dry, use a cold steam vaporizer or humidifier in your home.  Stay away from things that make you cough at work or at home.  If your cough is worse at night, try using extra pillows to raise your head up higher while you sleep.  Do not smoke, and try not to be around smoke. If you need help quitting, ask your doctor.  Do not have caffeine.  Do not drink alcohol.  Rest as needed. Contact a doctor if:  You have new problems (symptoms).  You cough up yellow fluid (pus).  Your cough does not get better after 2-3 weeks, or your cough gets worse.  Medicine does not help your cough and you are not sleeping well.  You have pain that gets worse or pain that is not helped with medicine.  You have a fever.  You are losing weight and you do not know why.  You have night sweats. Get help right away if:  You cough up blood.  You have trouble breathing.  Your heartbeat is very fast. This information is not intended to replace advice given to you by your health care provider. Make sure you discuss any questions you have with your health care provider. Document Released: 05/27/2011 Document Revised: 02/19/2016 Document Reviewed: 11/20/2014 Elsevier Interactive Patient Education  2018 Elsevier Inc.  Sore Throat When you have a sore throat, your throat may:  Hurt.  Burn.  Feel irritated.  Feel scratchy.  Many things can cause a sore throat, including:  An infection.  Allergies.  Dryness in the air.  Smoke or pollution.  Gastroesophageal reflux disease (GERD).  A tumor.  A sore throat can be the first sign of another sickness. It can happen with other problems, like coughing or a fever. Most sore throats go away without treatment. Follow these instructions at home:  Take  over-the-counter medicines only as told by your doctor.  Drink enough fluids to keep your pee (urine) clear or pale yellow.  Rest when you feel you need to.  To help  with pain, try: ? Sipping warm liquids, such as broth, herbal tea, or warm water. ? Eating or drinking cold or frozen liquids, such as frozen ice pops. ? Gargling with a salt-water mixture 3-4 times a day or as needed. To make a salt-water mixture, add -1 tsp of salt in 1 cup of warm water. Mix it until you cannot see the salt anymore. ? Sucking on hard candy or throat lozenges. ? Putting a cool-mist humidifier in your bedroom at night. ? Sitting in the bathroom with the door closed for 5-10 minutes while you run hot water in the shower.  Do not use any tobacco products, such as cigarettes, chewing tobacco, and e-cigarettes. If you need help quitting, ask your doctor. Contact a doctor if:  You have a fever for more than 2-3 days.  You keep having symptoms for more than 2-3 days.  Your throat does not get better in 7 days.  You have a fever and your symptoms suddenly get worse. Get help right away if:  You have trouble breathing.  You cannot swallow fluids, soft foods, or your saliva.  You have swelling in your throat or neck that gets worse.  You keep feeling like you are going to throw up (vomit).  You keep throwing up. This information is not intended to replace advice given to you by your health care provider. Make sure you discuss any questions you have with your health care provider. Document Released: 06/22/2008 Document Revised: 05/09/2016 Document Reviewed: 07/04/2015 Elsevier Interactive Patient Education  2018 Elsevier Inc.      Edwina Barth, MD [facility]

## 2018-03-17 NOTE — Patient Instructions (Addendum)
IF you received an x-ray today, you will receive an invoice from [facility]. Please contact [facility] at [phone] with questions or concerns regarding your invoice.   IF you received labwork today, you will receive an invoice from [place]. Please contact [facility] at [phone] with questions or concerns regarding your invoice.   Our billing staff will not be able to assist you with questions regarding bills from these companies.  You will be contacted with the lab results as soon as they are available. The fastest way to get your results is to activate your My Chart account. Instructions are located on the last page of this paperwork. If you have not heard from [place] regarding the results in 2 weeks, please contact this office.     Cough, Adult A cough helps to clear your throat and lungs. A cough may last only 2-3 weeks (acute), or it may last longer than 8 weeks (chronic). Many different things can cause a cough. A cough may be a sign of an illness or another medical condition. Follow these instructions at home:  Pay attention to any changes in your cough.  Take medicines only as told by your doctor. ? If you were prescribed an antibiotic medicine, take it as told by your doctor. Do not stop taking it even if you start to feel better. ? Talk with your doctor before you try using a cough medicine.  Drink enough fluid to keep your pee (urine) clear or pale yellow.  If the air is dry, use a cold steam vaporizer or humidifier in your home.  Stay away from things that make you cough at work or at home.  If your cough is worse at night, try using extra pillows to raise your head up higher while you sleep.  Do not smoke, and try not to be around smoke. If you need help quitting, ask your doctor.  Do not have caffeine.  Do not drink alcohol.  Rest as needed. Contact a doctor if:  You have new problems (symptoms).  You cough up yellow fluid  (pus).  Your cough does not get better after 2-3 weeks, or your cough gets worse.  Medicine does not help your cough and you are not sleeping well.  You have pain that gets worse or pain that is not helped with medicine.  You have a fever.  You are losing weight and you do not know why.  You have night sweats. Get help right away if:  You cough up blood.  You have trouble breathing.  Your heartbeat is very fast. This information is not intended to replace advice given to you by your health care provider. Make sure you discuss any questions you have with your health care provider. Document Released: 05/27/2011 Document Revised: 02/19/2016 Document Reviewed: 11/20/2014 Elsevier Interactive Patient Education  2018 Elsevier Inc.  Sore Throat When you have a sore throat, your throat may:  Hurt.  Burn.  Feel irritated.  Feel scratchy.  Many things can cause a sore throat, including:  An infection.  Allergies.  Dryness in the air.  Smoke or pollution.  Gastroesophageal reflux disease (GERD).  A tumor.  A sore throat can be the first sign of another sickness. It can happen with other problems, like coughing or a fever. Most sore throats go away without treatment. Follow these instructions at home:  Take over-the-counter medicines only as told by your doctor.  Drink enough fluids to keep your pee (urine) clear or  pale yellow.  Rest when you feel you need to.  To help with pain, try: ? Sipping warm liquids, such as broth, herbal tea, or warm water. ? Eating or drinking cold or frozen liquids, such as frozen ice pops. ? Gargling with a salt-water mixture 3-4 times a day or as needed. To make a salt-water mixture, add -1 tsp of salt in 1 cup of warm water. Mix it until you cannot see the salt anymore. ? Sucking on hard candy or throat lozenges. ? Putting a cool-mist humidifier in your bedroom at night. ? Sitting in the bathroom with the door closed for 5-10  minutes while you run hot water in the shower.  Do not use any tobacco products, such as cigarettes, chewing tobacco, and e-cigarettes. If you need help quitting, ask your doctor. Contact a doctor if:  You have a fever for more than 2-3 days.  You keep having symptoms for more than 2-3 days.  Your throat does not get better in 7 days.  You have a fever and your symptoms suddenly get worse. Get help right away if:  You have trouble breathing.  You cannot swallow fluids, soft foods, or your saliva.  You have swelling in your throat or neck that gets worse.  You keep feeling like you are going to throw up (vomit).  You keep throwing up. This information is not intended to replace advice given to you by your health care provider. Make sure you discuss any questions you have with your health care provider. Document Released: 06/22/2008 Document Revised: 05/09/2016 Document Reviewed: 07/04/2015 Elsevier Interactive Patient Education  [employer]2018 Elsevier Inc.

## 2018-08-14 IMAGING — DX DG CHEST 2V
2 series · 2 of 2 positions shown · non-contrast
Comparison: Chest x-ray dated 10/20/2015.

CLINICAL DATA: Mild Hemoptysis, 15 pack year history quit 30 years
ago, no SOB, JIM.

EXAM:
CHEST  2 VIEW

[chest pa]
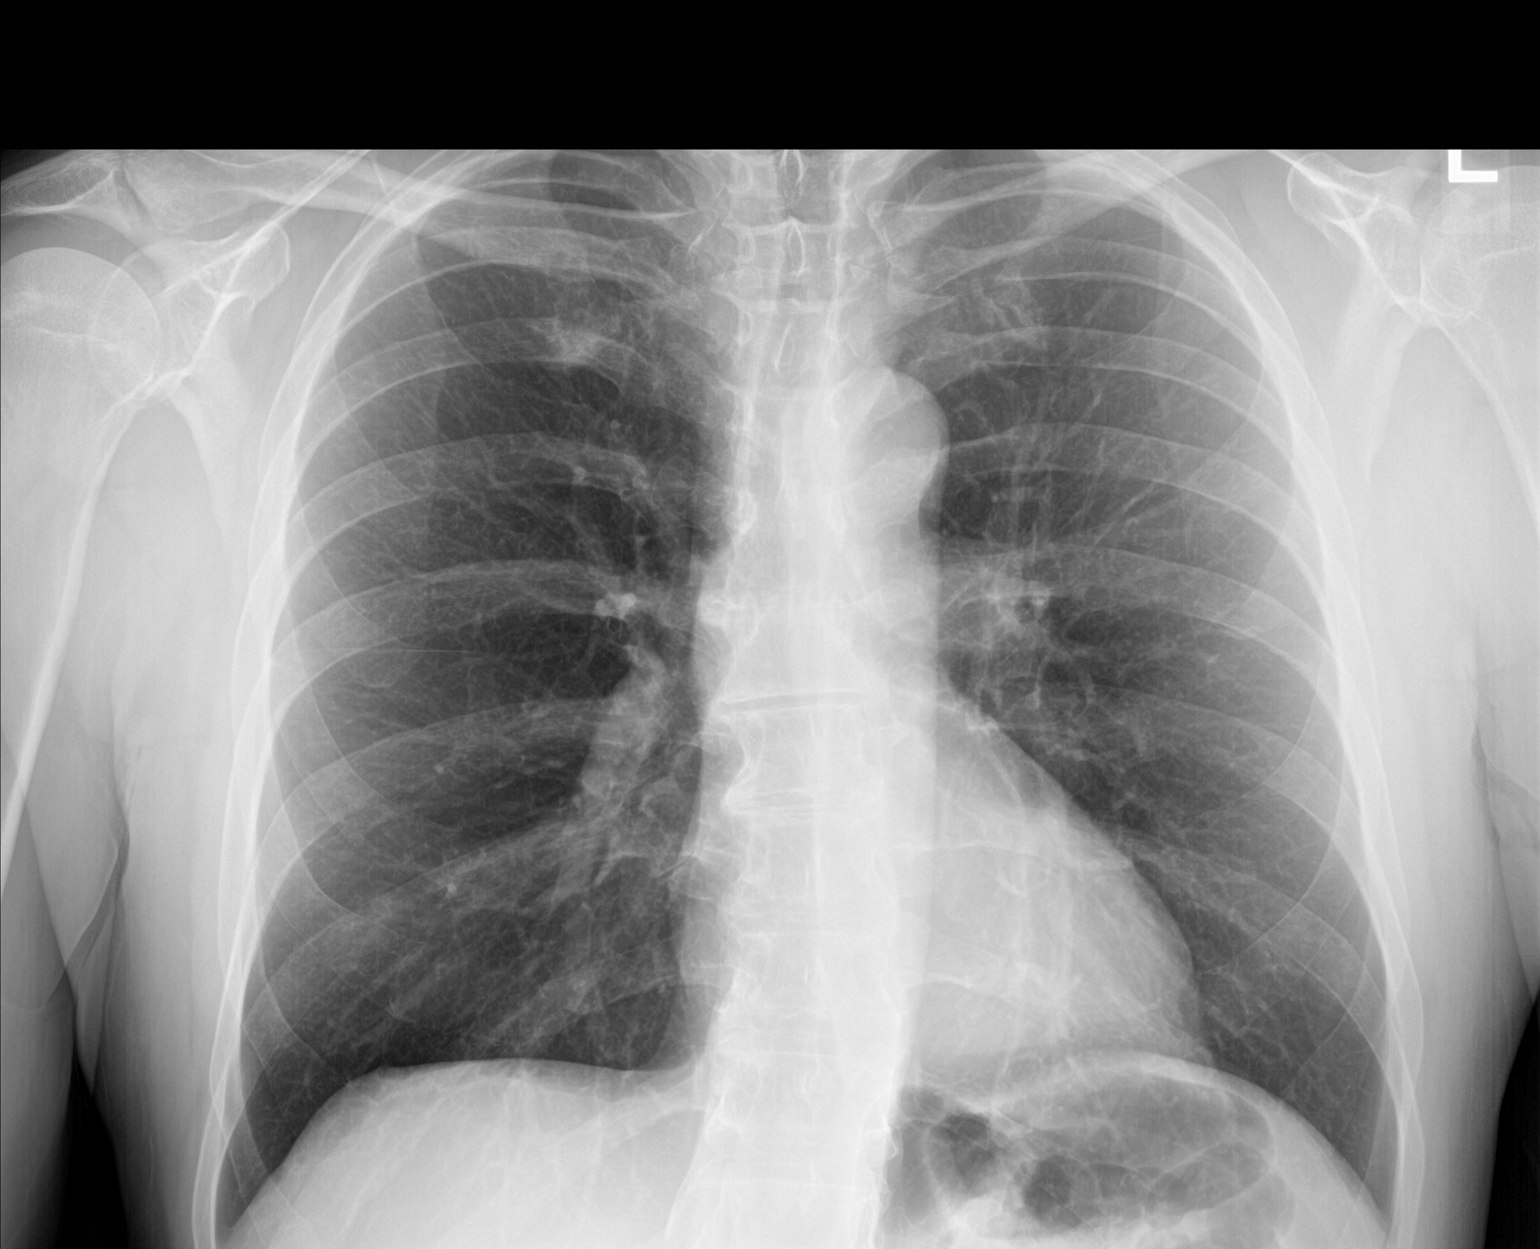

[chest lat]
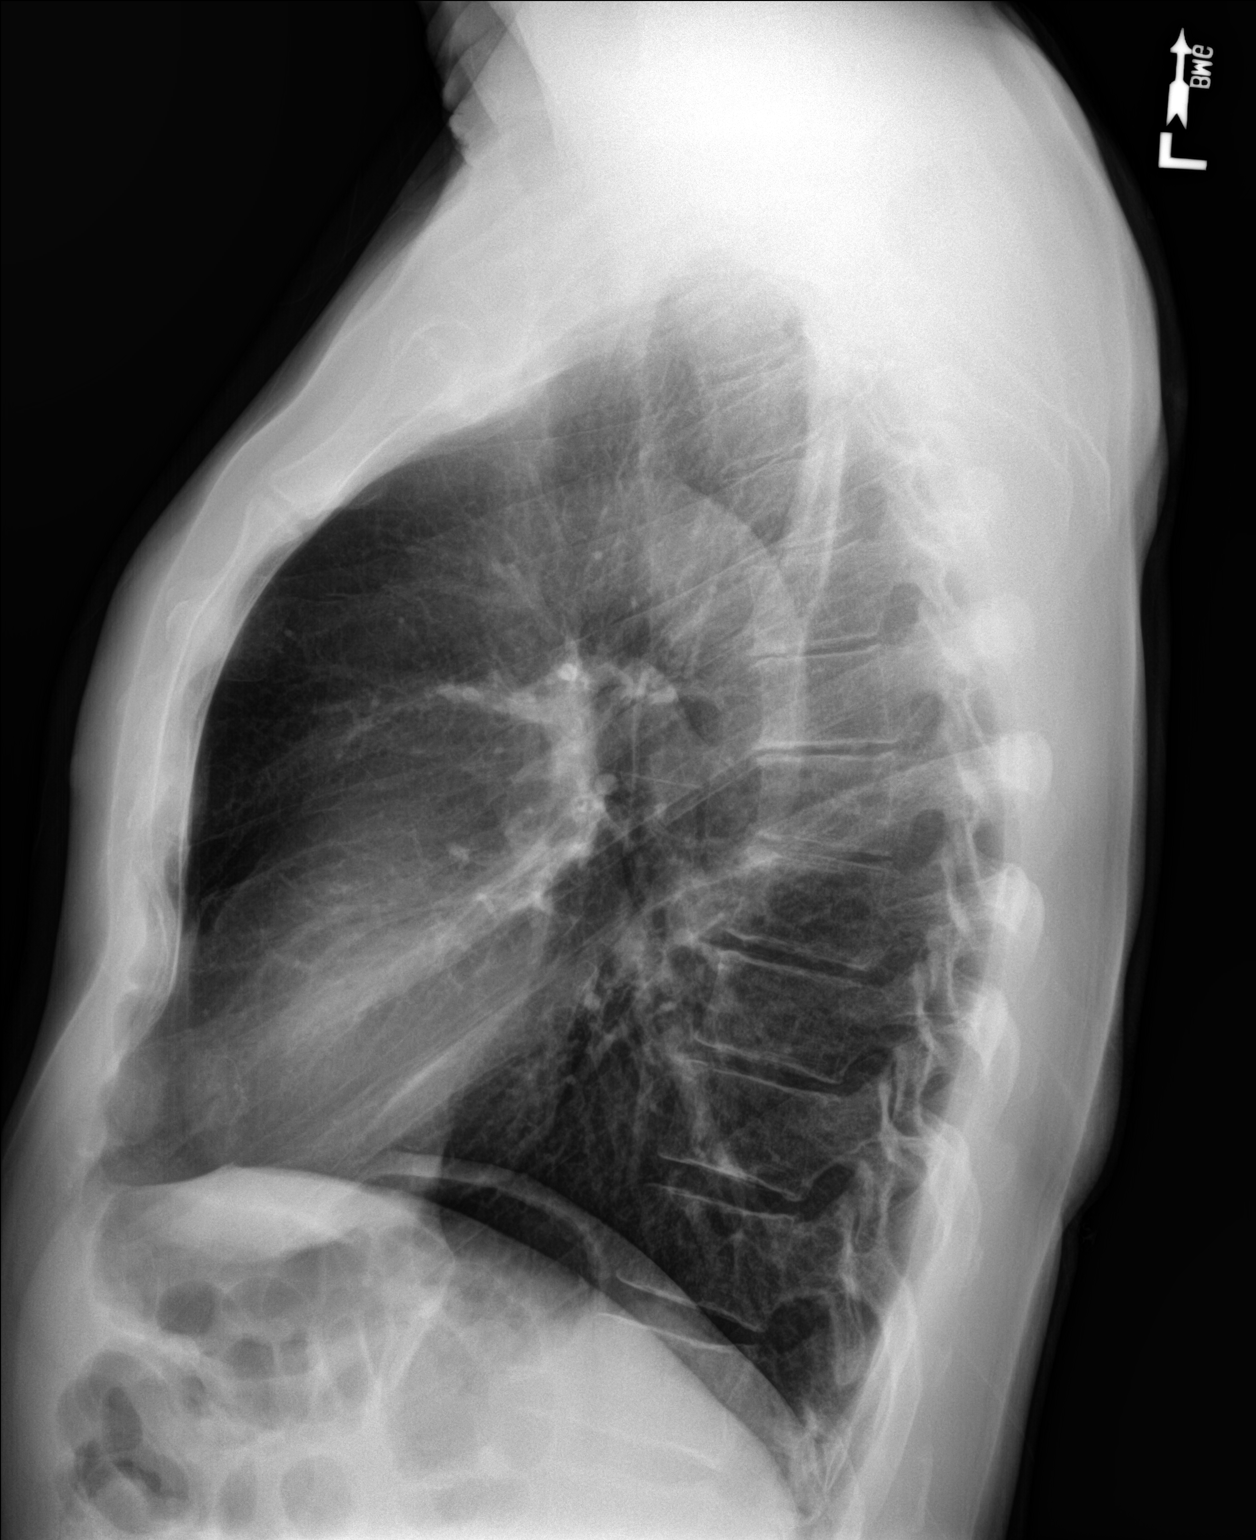

[2 of 2 positions shown; findings below may reference images not displayed]

FINDINGS: The heart size and mediastinal contours are within normal limits.
Both lungs are clear. Mild levoscoliosis of the thoracolumbar spine.
Mild degenerative spurring within the thoracic spine. No acute or
suspicious osseous finding.
IMPRESSION: No active cardiopulmonary disease. No evidence of pneumonia or
pulmonary edema.

## 2018-09-04 ENCOUNTER — Encounter: Payer: Self-pay | Admitting: Emergency Medicine

## 2018-09-04 ENCOUNTER — Ambulatory Visit: Payer: BC Managed Care – PPO | Admitting: Emergency Medicine

## 2018-09-04 VITALS — BP 161/83 | HR 67 | Temp 98.7°F | Resp 16 | Ht 73.0 in | Wt 166.0 lb

## 2018-09-04 DIAGNOSIS — J040 Acute laryngitis: Secondary | ICD-10-CM | POA: Diagnosis not present

## 2018-09-04 DIAGNOSIS — J22 Unspecified acute lower respiratory infection: Secondary | ICD-10-CM | POA: Diagnosis not present

## 2018-09-04 DIAGNOSIS — R059 Cough, unspecified: Secondary | ICD-10-CM

## 2018-09-04 DIAGNOSIS — R05 Cough: Secondary | ICD-10-CM

## 2018-09-04 MED ORDER — PROMETHAZINE-CODEINE 6.25-10 MG/5ML PO SYRP: 5.0000 mL | ORAL_SOLUTION | Freq: Every evening | ORAL | 0 refills | Status: DC | PRN

## 2018-09-04 MED ORDER — PREDNISONE 20 MG PO TABS: 40.0000 mg | ORAL_TABLET | Freq: Every day | ORAL | 0 refills | Status: AC

## 2018-09-04 MED ORDER — AZITHROMYCIN 250 MG PO TABS: ORAL_TABLET | ORAL | 0 refills | Status: DC

## 2018-09-04 NOTE — Progress Notes (Signed)
Taylor Morton 62 y.o.   Chief Complaint  Patient presents with  . Nasal Congestion    x 1 wk  . Sore Throat    pt states he feels pain on his lymph nodes/ x 1wk  . Cough    pt states his lungs are on fire and around is chest and back    HISTORY OF PRESENT ILLNESS: This is a 62 y.o. male complaining of cough for [redacted] weeks along with sore throat, nasal and chest congestion.  Also complaining of swollen lymph nodes on his neck and also his lungs feel like "they're on fire".  HPI   Prior to Admission medications   Medication Sig Start Date End Date Taking? Authorizing Provider  albuterol (PROVENTIL HFA;VENTOLIN HFA) 108 (90 Base) MCG/ACT inhaler Inhale 1-2 puffs into the lungs every 6 (six) hours as needed for wheezing or shortness of breath. Patient not taking: Reported on 09/04/2018 11/03/17   Shade FloodGreene, Jeffrey R, MD  aspirin EC 81 MG tablet Take 1 tablet (81 mg total) by mouth daily. Patient not taking: Reported on 03/17/2018 02/28/17   Ofilia Neaslark, Michael L, PA-C  atorvastatin (LIPITOR) 20 MG tablet Take 1 tablet (20 mg total) by mouth daily. Patient not taking: Reported on 03/17/2018 03/03/17   Ofilia Neaslark, Michael L, PA-C  azithromycin [facility](ZITHROMAX) 250 MG tablet Sig as indicated Patient not taking: Reported on 09/04/2018 03/17/18   Georgina QuintSagardia, Con Arganbright Jose, MD  benzonatate (TESSALON) 100 MG capsule Take 1 capsule (100 mg total) by mouth 3 (three) times daily as needed for cough. Patient not taking: Reported on 03/17/2018 11/03/17   Shade FloodGreene, Jeffrey R, MD  lisinopril (PRINIVIL,ZESTRIL) 10 MG tablet Take 1 tablet (10 mg total) by mouth daily. Patient not taking: Reported on 03/17/2018 03/03/17   Ofilia Neaslark, Michael L, PA-C    No Known Allergies  Patient Active Problem List   Diagnosis Date Noted  . Cough 03/17/2018  . Sore throat 03/17/2018  . Lower resp. tract infection 03/17/2018  . Coronary artery calcification 02/28/2017  . Asthma     Past Medical History:  Diagnosis Date  . Asthma   . Bradycardia   .  Chest pain     Past Surgical History:  Procedure Laterality Date  . APPENDECTOMY      Social History   Socioeconomic History  . Marital status: Married    Spouse name: Annabelle HarmanDana  . Number of children: 2  . Years of education: 5116  . Highest education level: Not on file  Occupational History  . Occupation: Advertising account executivechool Teacher    Employer: [employer]    Comment: [employer]  Social Needs  . Financial resource strain: Not on file  . Food insecurity:    Worry: Not on file    Inability: Not on file  . Transportation needs:    Medical: Not on file    Non-medical: Not on file  Tobacco Use  . Smoking status: Former Games developermoker  . Smokeless tobacco: Never Used  Substance and Sexual Activity  . Alcohol use: No  . Drug use: No  . Sexual activity: Yes  Lifestyle  . Physical activity:    Days per week: Not on file    Minutes per session: Not on file  . Stress: Not on file  Relationships  . Social connections:    Talks on phone: Not on file    Gets together: Not on file    Attends religious service: Not on file    Active member  of club or organization: Not on file    Attends meetings of clubs or organizations: Not on file    Relationship status: Not on file  . Intimate partner violence:    Fear of current or ex partner: Not on file    Emotionally abused: Not on file    Physically abused: Not on file    Forced sexual activity: Not on file  Other Topics Concern  . Not on file  Social History Narrative   Lives with his wife.  Sons are both in college.    Family History  Problem Relation Age of Onset  . Cancer Father   . Arthritis Mother      Review of Systems  Constitutional: Negative.  Negative for chills and fever.  HENT: Positive for sore throat. Negative for hearing loss.   Eyes: Negative for blurred vision and double vision.  Respiratory: Positive for cough, sputum production and wheezing. Negative for shortness of breath.     Cardiovascular: Negative.  Negative for chest pain.  Gastrointestinal: Negative for abdominal pain, blood in stool, diarrhea, melena, nausea and vomiting.  Genitourinary: Negative.   Musculoskeletal: Positive for neck pain. Negative for myalgias.  Skin: Negative.   Neurological: Negative.  Negative for dizziness and headaches.  Endo/Heme/Allergies: Negative.   All other systems reviewed and are negative.   Vitals:   09/04/18 1642  BP: (!) 161/83  Pulse: 67  Resp: 16  Temp: 98.7 F (37.1 C)  SpO2: 96%    Physical Exam  Constitutional: He is oriented to person, place, and time. He appears well-developed and well-nourished.  HENT:  Head: Normocephalic and atraumatic.  Right Ear: External ear normal.  Left Ear: External ear normal.  Nose: Nose normal.  Mouth/Throat: Uvula is midline. Posterior oropharyngeal erythema present. No oropharyngeal exudate, posterior oropharyngeal edema or tonsillar abscesses.  Eyes: Pupils are equal, round, and reactive to light. Conjunctivae and EOM are normal.  Neck: Normal range of motion. Neck supple. No thyromegaly present.  Cardiovascular: Normal rate, regular rhythm and normal heart sounds.  Pulmonary/Chest: Effort normal and breath sounds normal.  Abdominal: Soft. There is no tenderness.  Musculoskeletal: Normal range of motion.  Lymphadenopathy:    He has cervical adenopathy.  Neurological: He is alert and oriented to person, place, and time. No sensory deficit. He exhibits normal muscle tone.  Skin: Skin is warm and dry.  Psychiatric: He has a normal mood and affect. His behavior is normal.  Vitals reviewed.  A total of 25 minutes was spent in the room with the patient, greater than 50% of which was in counseling/coordination of care regarding diagnosis, medications, treatment, and need for follow-up if no better or worse in 1 week.   ASSESSMENT & PLAN: Taylor Morton was seen today for nasal congestion, sore throat and cough.  Diagnoses and  all orders for this visit:  Cough -     promethazine-codeine (PHENERGAN WITH CODEINE) 6.25-10 MG/5ML syrup; Take 5 mLs by mouth at bedtime as needed for cough.  Lower respiratory infection -     azithromycin (ZITHROMAX) 250 MG tablet; Sig as indicated  Laryngitis -     predniSONE (DELTASONE) 20 MG tablet; Take 2 tablets (40 mg total) by mouth daily with breakfast for 5 days.    Patient Instructions       If you have lab work done today you will be contacted with your lab results within the next 2 weeks.  If you have not heard from [place] then please contact us.  The fastest way to get your results is to register for My Chart.   IF you received an x-ray today, you will receive an invoice from [facility]. Please contact [facility] at [phone] with questions or concerns regarding your invoice.   IF you received labwork today, you will receive an invoice from [place]. Please contact [facility] at [phone] with questions or concerns regarding your invoice.   Our billing staff will not be able to assist you with questions regarding bills from these companies.  You will be contacted with the lab results as soon as they are available. The fastest way to get your results is to activate your My Chart account. Instructions are located on the last page of this paperwork. If you have not heard from [place] regarding the results in 2 weeks, please contact this office.      Cough, Adult A cough helps to clear your throat and lungs. A cough may last only 2-3 weeks (acute), or it may last longer than 8 weeks (chronic). Many different things can cause a cough. A cough may be a sign of an illness or another medical condition. Follow these instructions at home:  Pay attention to any changes in your cough.  Take medicines only as told by your doctor. ? If you were prescribed an antibiotic medicine, take it as told by your doctor. Do not stop taking it even if you start to feel  better. ? Talk with your doctor before you try using a cough medicine.  Drink enough fluid to keep your pee (urine) clear or pale yellow.  If the air is dry, use a cold steam vaporizer or humidifier in your home.  Stay away from things that make you cough at work or at home.  If your cough is worse at night, try using extra pillows to raise your head up higher while you sleep.  Do not smoke, and try not to be around smoke. If you need help quitting, ask your doctor.  Do not have caffeine.  Do not drink alcohol.  Rest as needed. Contact a doctor if:  You have new problems (symptoms).  You cough up yellow fluid (pus).  Your cough does not get better after 2-3 weeks, or your cough gets worse.  Medicine does not help your cough and you are not sleeping well.  You have pain that gets worse or pain that is not helped with medicine.  You have a fever.  You are losing weight and you do not know why.  You have night sweats. Get help right away if:  You cough up blood.  You have trouble breathing.  Your heartbeat is very fast. This information is not intended to replace advice given to you by your health care provider. Make sure you discuss any questions you have with your health care provider. Document Released: 05/27/2011 Document Revised: 02/19/2016 Document Reviewed: 11/20/2014 Elsevier Interactive Patient Education  2018 Elsevier Inc.      Edwina Barth, MD [facility]

## 2018-09-04 NOTE — Patient Instructions (Addendum)
     If you have lab work done today you will be contacted with your lab results within the next 2 weeks.  If you have not heard from us then please contact us. The fastest way to get your results is to register for My Chart.   IF you received an x-ray today, you will receive an invoice from [facility]. Please contact [facility] at [phone] with questions or concerns regarding your invoice.   IF you received labwork today, you will receive an invoice from [facility]. Please contact [facility] at [phone] with questions or concerns regarding your invoice.   Our billing staff will not be able to assist you with questions regarding bills from these companies.  You will be contacted with the lab results as soon as they are available. The fastest way to get your results is to activate your My Chart account. Instructions are located on the last page of this paperwork. If you have not heard from us regarding the results in 2 weeks, please contact this office.     Cough, Adult A cough helps to clear your throat and lungs. A cough may last only 2-3 weeks (acute), or it may last longer than 8 weeks (chronic). Many different things can cause a cough. A cough may be a sign of an illness or another medical condition. Follow these instructions at home:  Pay attention to any changes in your cough.  Take medicines only as told by your doctor. ? If you were prescribed an antibiotic medicine, take it as told by your doctor. Do not stop taking it even if you start to feel better. ? Talk with your doctor before you try using a cough medicine.  Drink enough fluid to keep your pee (urine) clear or pale yellow.  If the air is dry, use a cold steam vaporizer or humidifier in your home.  Stay away from things that make you cough at work or at home.  If your cough is worse at night, try using extra pillows to raise your head up higher while you sleep.  Do not smoke, and try not to be  around smoke. If you need help quitting, ask your doctor.  Do not have caffeine.  Do not drink alcohol.  Rest as needed. Contact a doctor if:  You have new problems (symptoms).  You cough up yellow fluid (pus).  Your cough does not get better after 2-3 weeks, or your cough gets worse.  Medicine does not help your cough and you are not sleeping well.  You have pain that gets worse or pain that is not helped with medicine.  You have a fever.  You are losing weight and you do not know why.  You have night sweats. Get help right away if:  You cough up blood.  You have trouble breathing.  Your heartbeat is very fast. This information is not intended to replace advice given to you by your health care provider. Make sure you discuss any questions you have with your health care provider. Document Released: 05/27/2011 Document Revised: 02/19/2016 Document Reviewed: 11/20/2014 Elsevier Interactive Patient Education  2018 Elsevier Inc.  

## 2018-10-04 ENCOUNTER — Encounter: Payer: Self-pay | Admitting: Emergency Medicine

## 2018-10-04 ENCOUNTER — Ambulatory Visit (INDEPENDENT_AMBULATORY_CARE_PROVIDER_SITE_OTHER): Payer: BC Managed Care – PPO

## 2018-10-04 ENCOUNTER — Ambulatory Visit (INDEPENDENT_AMBULATORY_CARE_PROVIDER_SITE_OTHER): Payer: BC Managed Care – PPO | Admitting: Emergency Medicine

## 2018-10-04 ENCOUNTER — Other Ambulatory Visit: Payer: Self-pay

## 2018-10-04 VITALS — BP 132/76 | HR 61 | Temp 98.8°F | Resp 16 | Ht 73.0 in | Wt 171.8 lb

## 2018-10-04 DIAGNOSIS — Z1329 Encounter for screening for other suspected endocrine disorder: Secondary | ICD-10-CM | POA: Diagnosis not present

## 2018-10-04 DIAGNOSIS — Z13228 Encounter for screening for other metabolic disorders: Secondary | ICD-10-CM

## 2018-10-04 DIAGNOSIS — Z130 Encounter for screening for diseases of the blood and blood-forming organs and certain disorders involving the immune mechanism: Secondary | ICD-10-CM

## 2018-10-04 DIAGNOSIS — R221 Localized swelling, mass and lump, neck: Secondary | ICD-10-CM | POA: Diagnosis not present

## 2018-10-04 DIAGNOSIS — Z0001 Encounter for general adult medical examination with abnormal findings: Secondary | ICD-10-CM

## 2018-10-04 DIAGNOSIS — Z13220 Encounter for screening for lipoid disorders: Secondary | ICD-10-CM

## 2018-10-04 DIAGNOSIS — N529 Male erectile dysfunction, unspecified: Secondary | ICD-10-CM

## 2018-10-04 DIAGNOSIS — Z1159 Encounter for screening for other viral diseases: Secondary | ICD-10-CM

## 2018-10-04 DIAGNOSIS — Z125 Encounter for screening for malignant neoplasm of prostate: Secondary | ICD-10-CM

## 2018-10-04 MED ORDER — TADALAFIL 20 MG PO TABS: 10.0000 mg | ORAL_TABLET | ORAL | 11 refills | Status: DC | PRN

## 2018-10-04 NOTE — Progress Notes (Signed)
BP Readings from Last 3 Encounters:  10/04/18 132/76  09/04/18 (!) 161/83  03/17/18 128/72   Lab Results  Component Value Date   CHOL 177 02/28/2017   HDL 60 02/28/2017   LDLCALC 104 (H) 02/28/2017   TRIG 67 02/28/2017   CHOLHDL 3.0 02/28/2017   Taylor Morton 63 y.o.   Chief Complaint  Patient presents with  . Annual Exam    HISTORY OF PRESENT ILLNESS: This is a 63 y.o. male here for his annual exam. Intermittent smoker. Noticed lump on left lower side of the neck last month.  Not going away.  At times tender. Physically active with good nutrition.   HPI   Prior to Admission medications   Medication Sig Start Date End Date Taking? Authorizing Provider  albuterol (PROVENTIL HFA;VENTOLIN HFA) 108 (90 Base) MCG/ACT inhaler Inhale 1-2 puffs into the lungs every 6 (six) hours as needed for wheezing or shortness of breath. Patient not taking: Reported on 10/04/2018 11/03/17   Shade Flood, MD  aspirin EC 81 MG tablet Take 1 tablet (81 mg total) by mouth daily. Patient not taking: Reported on 10/04/2018 02/28/17   Ofilia Neas, PA-C  atorvastatin (LIPITOR) 20 MG tablet Take 1 tablet (20 mg total) by mouth daily. Patient not taking: Reported on 10/04/2018 03/03/17   Ofilia Neas, PA-C  azithromycin [facility]) 250 MG tablet Sig as indicated Patient not taking: Reported on 10/04/2018 09/04/18   Georgina Quint, MD  lisinopril (PRINIVIL,ZESTRIL) 10 MG tablet Take 1 tablet (10 mg total) by mouth daily. Patient not taking: Reported on 10/04/2018 03/03/17   Ofilia Neas, PA-C    Allergies  Allergen Reactions  . Dust Mite Extract     Patient Active Problem List   Diagnosis Date Noted  . Laryngitis 09/04/2018  . Cough 03/17/2018  . Sore throat 03/17/2018  . Lower respiratory infection 03/17/2018  . Coronary artery calcification 02/28/2017  . Asthma     Past Medical History:  Diagnosis Date  . Asthma   . Bradycardia   . Chest pain     Past Surgical History:    Procedure Laterality Date  . APPENDECTOMY      Social History   Socioeconomic History  . Marital status: Married    Spouse name: Annabelle Harman  . Number of children: 2  . Years of education: 44  . Highest education level: Not on file  Occupational History  . 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[employer]    Comment: [employer]  Social Needs  . Financial resource strain: Not on file  . Food insecurity:    Worry: Not on file    Inability: Not on file  . Transportation needs:    Medical: Not on file    Non-medical: Not on file  Tobacco Use  . Smoking status: Former Games developer  . Smokeless tobacco: Never Used  Substance and Sexual Activity  . Alcohol use: No  . Drug use: No  . Sexual activity: Yes  Lifestyle  . Physical activity:    Days per week: Not on file    Minutes per session: Not on file  . Stress: Not on file  Relationships  . Social connections:    Talks on phone: Not on file    Gets together: Not on file    Attends religious service: Not on file    Active member of club or organization: Not on file    Attends meetings of clubs  or organizations: Not on file    Relationship status: Not on file  . Intimate partner violence:    Fear of current or ex partner: Not on file    Emotionally abused: Not on file    Physically abused: Not on file    Forced sexual activity: Not on file  Other Topics Concern  . Not on file  Social History Narrative   Lives with his wife.  Sons are both in college.    Family History  Problem Relation Age of Onset  . Cancer Father   . Arthritis Mother      Review of Systems  Constitutional: Negative.  Negative for chills, fever and weight loss.  HENT: Negative.  Negative for hearing loss and sore throat.   Eyes: Negative.  Negative for blurred vision and double vision.  Respiratory: Negative.  Negative for cough, shortness of breath and wheezing.   Cardiovascular: Negative.  Negative for chest  pain and palpitations.  Gastrointestinal: Negative.  Negative for abdominal pain, blood in stool, diarrhea, melena, nausea and vomiting.  Genitourinary: Negative.  Negative for dysuria.       Erectile dysfunction  Musculoskeletal: Negative.  Negative for back pain, myalgias and neck pain.       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  Negative for rash.  Neurological: Negative.  Negative for dizziness and headaches.  Endo/Heme/Allergies: Negative.   All other systems reviewed and are negative.     Vitals:   10/04/18 0820  BP: 132/76  Pulse: 61  Resp: 16  Temp: 98.8 F (37.1 C)  SpO2: 97%    Physical Exam Vitals signs reviewed.  Constitutional:      Appearance: Normal appearance.  HENT:     Head: Normocephalic and atraumatic.     Nose: Nose normal.     Mouth/Throat:     Mouth: Mucous membranes are moist.     Pharynx: Oropharynx is clear.  Eyes:     Extraocular Movements: Extraocular movements intact.     Conjunctiva/sclera: Conjunctivae normal.     Pupils: Pupils are equal, round, and reactive to light.  Neck:     Musculoskeletal: Normal range of motion and neck supple.     Comments: Small lightly tender lump to left thyroid gland Cardiovascular:     Rate and Rhythm: Normal rate and regular rhythm.     Pulses: Normal pulses.     Heart sounds: Normal heart sounds.  Pulmonary:     Breath sounds: Normal breath sounds.  Abdominal:     General: Abdomen is flat. Bowel sounds are normal. There is no distension.     Palpations: Abdomen is soft. There is no mass.     Tenderness: There is no abdominal tenderness.     Hernia: No hernia is present.  Musculoskeletal: Normal range of motion.  Skin:    General: Skin is warm and dry.     Capillary Refill: Capillary refill takes less than 2 seconds.  Neurological:     General: No focal deficit present.     Mental Status: He is alert and oriented to person, place, and time.     Sensory: No sensory deficit.     Motor: No  weakness.     Coordination: Coordination normal.  Psychiatric:        Mood and Affect: Mood normal.        Behavior: Behavior normal.    Dg Chest 2 View  Result Date: 10/04/2018 CLINICAL DATA:  63 year old male with  lower neck adenopathy and a history of smoking. EXAM: CHEST - 2 VIEW COMPARISON:  Prior chest x-ray 03/17/2018; prior CT scan of the chest 02/22/2017 FINDINGS: The lungs are clear and negative for focal airspace consolidation, pulmonary edema or suspicious pulmonary nodule. No pleural effusion or pneumothorax. Cardiac and mediastinal contours are within normal limits. Atherosclerotic calcifications are present in the transverse aorta. No acute fracture or lytic or blastic osseous lesions. The visualized upper abdominal bowel gas pattern is unremarkable. IMPRESSION: No active cardiopulmonary disease. Electronically Signed   By: Malachy MoanHeath  McCullough M.D.   On: 10/04/2018 09:03     ASSESSMENT & PLAN: [name] was seen today for annual exam.  Diagnoses and all orders for this visit:  Encounter for general adult medical examination with abnormal findings -     Comprehensive metabolic panel  Screening for deficiency anemia -     CBC  Screening for lipoid disorders -     Lipid panel  Screening for endocrine, metabolic and immunity disorder -     Lipid panel -     TSH -     Comprehensive metabolic panel  Prostate cancer screening -     PSA  Need for hepatitis C screening test -     Hepatitis C antibody  Erectile dysfunction, unspecified erectile dysfunction type -     tadalafil (ADCIRCA/CIALIS) 20 MG tablet; Take 0.5-1 tablets (10-20 mg total) by mouth every other day as needed for erectile dysfunction.  Localized swelling, mass or lump of neck -     DG Chest 2 View; Future -     US Soft Tissue Head/Neck; Future    Patient Instructions       If you have lab work done today you will be contacted with your lab results within the next 2 weeks.  If you have not heard  from us then please contact us. The fastest way to get your results is to register for My Chart.   IF you received an x-ray today, you will receive an invoice from [facility]. Please contact [facility] at [phone] with questions or concerns regarding your invoice.   IF you received labwork today, you will receive an invoice from [facility]. Please contact [facility] at [phone] with questions or concerns regarding your invoice.   Our billing staff will not be able to assist you with questions regarding bills from these companies.  You will be contacted with the lab results as soon as they are available. The fastest way to get your results is to activate your My Chart account. Instructions are located on the last page of this paperwork. If you have not heard from us regarding the results in 2 weeks, please contact this office.      Health Maintenance, Male A healthy lifestyle and preventive care is important for your health and wellness. Ask your health care provider about what schedule of regular examinations is right for you. What should I know about weight and diet? Eat a Healthy Diet  Eat plenty of vegetables, fruits, whole grains, low-fat dairy products, and lean protein.  Do not eat a lot of foods high in solid fats, added sugars, or salt.  Maintain a Healthy Weight Regular exercise can help you achieve or maintain a healthy weight. You should:  Do at least 150 minutes of exercise each week. The exercise should increase your heart rate and make you sweat (moderate-intensity exercise).  Do strength-training exercises at least twice a week. Watch Your Levels of Cholesterol and  Blood Lipids  Have your blood tested for lipids and cholesterol every 5 years starting at 64 years of age. If you are at high risk for heart disease, you should start having your blood tested when you are 64 years old. You may need to have your cholesterol levels checked more often  if: ? Your lipid or cholesterol levels are high. ? You are older than 64 years of age. ? You are at high risk for heart disease. What should I know about cancer screening? Many types of cancers can be detected early and may often be prevented. Lung Cancer  You should be screened every year for lung cancer if: ? You are a current smoker who has smoked for at least 30 years. ? You are a former smoker who has quit within the past 15 years.  Talk to your health care provider about your screening options, when you should start screening, and how often you should be screened. Colorectal Cancer  Routine colorectal cancer screening usually begins at 63 years of age and should be repeated every 5-10 years until you are 64 years old. You may need to be screened more often if early forms of precancerous polyps or small growths are found. Your health care provider may recommend screening at an earlier age if you have risk factors for colon cancer.  Your health care provider may recommend using home test kits to check for hidden blood in the stool.  A small camera at the end of a tube can be used to examine your colon (sigmoidoscopy or colonoscopy). This checks for the earliest forms of colorectal cancer. Prostate and Testicular Cancer  Depending on your age and overall health, your health care provider may do certain tests to screen for prostate and testicular cancer.  Talk to your health care provider about any symptoms or concerns you have about testicular or prostate cancer. Skin Cancer  Check your skin from head to toe regularly.  Tell your health care provider about any new moles or changes in moles, especially if: ? There is a change in a mole's size, shape, or color. ? You have a mole that is larger than a pencil eraser.  Always use sunscreen. Apply sunscreen liberally and repeat throughout the day.  Protect yourself by wearing long sleeves, pants, a wide-brimmed hat, and sunglasses  when outside. What should I know about heart disease, diabetes, and high blood pressure?  If you are 12-64 years of age, have your blood pressure checked every 3-5 years. If you are 53 years of age or older, have your blood pressure checked every year. You should have your blood pressure measured twice-once when you are at a hospital or clinic, and once when you are not at a hospital or clinic. Record the average of the two measurements. To check your blood pressure when you are not at a hospital or clinic, you can use: ? An automated blood pressure machine at a pharmacy. ? A home blood pressure monitor.  Talk to your health care provider about your target blood pressure.  If you are between 92-96 years old, ask your health care provider if you should take aspirin to prevent heart disease.  Have regular diabetes screenings by checking your fasting blood sugar level. ? If you are at a normal weight and have a low risk for diabetes, have this test once every three years after the age of 77. ? If you are overweight and have a high risk for diabetes, consider being  tested at a younger age or more often.  A one-time screening for abdominal aortic aneurysm (AAA) by ultrasound is recommended for men aged 65-75 years who are current or former smokers. What should I know about preventing infection? Hepatitis B If you have a higher risk for hepatitis B, you should be screened for this virus. Talk with your health care provider to find out if you are at risk for hepatitis B infection. Hepatitis C Blood testing is recommended for:  Everyone born from [birth year] through 1966.  Anyone with known risk factors for hepatitis C. Sexually Transmitted Diseases (STDs)  You should be screened each year for STDs including gonorrhea and chlamydia if: ? You are sexually active and are younger than 64 years of age. ? You are older than 64 years of age and your health care provider tells you that you are at risk for  this type of infection. ? Your sexual activity has changed since you were last screened and you are at an increased risk for chlamydia or gonorrhea. Ask your health care provider if you are at risk.  Talk with your health care provider about whether you are at high risk of being infected with HIV. Your health care provider may recommend a prescription medicine to help prevent HIV infection. What else can I do?  Schedule regular health, dental, and eye exams.  Stay current with your vaccines (immunizations).  Do not use any tobacco products, such as cigarettes, chewing tobacco, and e-cigarettes. If you need help quitting, ask your health care provider.  Limit alcohol intake to no more than 2 drinks per day. One drink equals 12 ounces of beer, 5 ounces of wine, or 1 ounces of hard liquor.  Do not use street drugs.  Do not share needles.  Ask your health care provider for help if you need support or information about quitting drugs.  Tell your health care provider if you often feel depressed.  Tell your health care provider if you have ever been abused or do not feel safe at home. This information is not intended to replace advice given to you by your health care provider. Make sure you discuss any questions you have with your health care provider. Document Released: 03/11/2008 Document Revised: 05/12/2016 Document Reviewed: 06/17/2015 Elsevier Interactive Patient Education  2019 [employer].      Edwina Barth, MD.patins  [facility]

## 2018-10-04 NOTE — Patient Instructions (Addendum)

## 2018-10-05 LAB — COMPREHENSIVE METABOLIC PANEL
ALT: 29 IU/L (ref 0–44)
AST: 26 IU/L (ref 0–40)
Albumin/Globulin Ratio: 2.8 — ABNORMAL HIGH (ref 1.2–2.2)
Albumin: 5.3 g/dL — ABNORMAL HIGH (ref 3.6–4.8)
Alkaline Phosphatase: 90 IU/L (ref 39–117)
BUN/Creatinine Ratio: 23 (ref 10–24)
BUN: 18 mg/dL (ref 8–27)
Bilirubin Total: 1.4 mg/dL — ABNORMAL HIGH (ref 0.0–1.2)
CO2: 24 mmol/L (ref 20–29)
Calcium: 9.8 mg/dL (ref 8.6–10.2)
Chloride: 99 mmol/L (ref 96–106)
Creatinine, Ser: 0.79 mg/dL (ref 0.76–1.27)
GFR calc Af Amer: 110 mL/min/{1.73_m2} (ref >59)
GFR calc non Af Amer: 96 mL/min/{1.73_m2} (ref >59)
Globulin, Total: 1.9 g/dL (ref 1.5–4.5)
Glucose: 89 mg/dL (ref 65–99)
Potassium: 4.9 mmol/L (ref 3.5–5.2)
Sodium: 139 mmol/L (ref 134–144)
Total Protein: 7.2 g/dL (ref 6.0–8.5)

## 2018-10-05 LAB — LIPID PANEL
Chol/HDL Ratio: 2.8 ratio (ref 0.0–5.0)
Cholesterol, Total: 203 mg/dL — ABNORMAL HIGH (ref 100–199)
HDL: 73 mg/dL (ref >39)
LDL Calculated: 114 mg/dL — ABNORMAL HIGH (ref 0–99)
Triglycerides: 80 mg/dL (ref 0–149)
VLDL Cholesterol Cal: 16 mg/dL (ref 5–40)

## 2018-10-05 LAB — HEPATITIS C ANTIBODY: Hep C Virus Ab: <0.1 s/co ratio (ref 0.0–0.9)

## 2018-10-05 LAB — PSA: PROSTATE SPECIFIC AG, SERUM: 2.6 ng/mL (ref 0.0–4.0)

## 2018-10-05 LAB — CBC
HEMATOCRIT: 42.5 % (ref 37.5–51.0)
Hemoglobin: 14.9 g/dL (ref 13.0–17.7)
MCH: 32.3 pg (ref 26.6–33.0)
MCHC: 35.1 g/dL (ref 31.5–35.7)
MCV: 92 fL (ref 79–97)
Platelets: 272 10*3/uL (ref 150–450)
RBC: 4.61 x10E6/uL (ref 4.14–5.80)
RDW: 12.5 % (ref 11.6–15.4)
WBC: 4.1 10*3/uL (ref 3.4–10.8)

## 2018-10-05 LAB — TSH: TSH: 4.31 u[IU]/mL (ref 0.450–4.500)

## 2018-10-09 ENCOUNTER — Telehealth: Payer: Self-pay | Admitting: Emergency Medicine

## 2018-10-09 NOTE — Telephone Encounter (Unsigned)
Copied from CRM [id number]. Topic: Referral - Status >> Oct 09, 2018  3:09 PM Leafy Ro wrote: Reason for CRM: pt is calling checking on the status of US soft tissue head/neck .

## 2018-10-10 NOTE — Telephone Encounter (Signed)
Pt informed of appointment on 10/12/2018 at [facility] for [place], he verbalized understanding.

## 2018-10-12 ENCOUNTER — Ambulatory Visit (HOSPITAL_COMMUNITY)
Admission: RE | Admit: 2018-10-12 | Discharge: 2018-10-12 | Disposition: A | Discharge status: Home / Self Care | Payer: BC Managed Care – PPO | Source: Ambulatory Visit | Attending: Emergency Medicine | Admitting: Emergency Medicine

## 2018-10-12 DIAGNOSIS — R221 Localized swelling, mass and lump, neck: Secondary | ICD-10-CM | POA: Diagnosis present

## 2018-10-16 ENCOUNTER — Telehealth: Payer: Self-pay

## 2018-10-16 NOTE — Telephone Encounter (Signed)
Lab letter sent via mail to pt home address. Dgaddy, CMA 

## 2019-01-16 ENCOUNTER — Other Ambulatory Visit: Payer: Self-pay | Admitting: Family Medicine

## 2019-01-16 NOTE — Telephone Encounter (Signed)
Requested Prescriptions  Pending Prescriptions Disp Refills  . albuterol (VENTOLIN HFA) 108 (90 Base) MCG/ACT inhaler [Pharmacy Med Name: ALBUTEROL HFA (PROAIR) INHALER] 8.5 Inhaler 0    Sig: INHALE 1-2 PUFFS INTO THE LUNGS EVERY 6 (SIX) HOURS AS NEEDED FOR WHEEZING OR SHORTNESS OF BREATH.     Pulmonology:  Beta Agonists Failed - 01/16/2019 11:33 AM      Failed - One inhaler should last at least one month. If the patient is requesting refills earlier, contact the patient to check for uncontrolled symptoms.      Passed - Valid encounter within last 12 months    Recent Outpatient Visits          3 months ago Encounter for general adult medical examination with abnormal findings   Primary Care at [facility], Eilleen Kempf, MD   4 months ago Cough   Primary Care at [facility], Eilleen Kempf, MD   10 months ago Cough   Primary Care at [facility], Eilleen Kempf, MD   1 year ago Cough   Primary Care at [facility], Asencion Partridge, MD   1 year ago Coronary artery calcification   Primary Care at [facility], Marolyn Hammock, PA-C

## 2019-07-27 ENCOUNTER — Telehealth: Payer: Self-pay | Admitting: Emergency Medicine

## 2019-07-27 NOTE — Telephone Encounter (Signed)
Patient would like a follow up call today regarding message mentioned below, please advise

## 2019-07-27 NOTE — Telephone Encounter (Signed)
Pt dropped a form off for Dr. Mitchel Honour to fill out. He would like [place] to call him at [phone] when complete and he will pick up, I haved placed form at nurse's station.

## 2019-07-31 ENCOUNTER — Telehealth: Payer: Self-pay | Admitting: *Deleted

## 2019-07-31 NOTE — Telephone Encounter (Signed)
Called patient to let him know paper work is ready. Unable to leave message in voice mail because it is full.

## 2019-07-31 NOTE — Telephone Encounter (Signed)
Spoke to patient to let him know paper work he left to have signed is ready for pick up at check in. Patient states he picked up paper work today.

## 2019-08-31 ENCOUNTER — Telehealth: Payer: Self-pay | Admitting: Emergency Medicine

## 2019-08-31 NOTE — Telephone Encounter (Signed)
08/31/2019 - PATIENT CAME IN ON Friday (08/31/2019) AND DROPPED OFF FMLA FORMS FROM [place] [employer]. THE FORMS WERE PLACED IN DR. Pearson Forster [place] AT [place]

## 2019-09-06 NOTE — Telephone Encounter (Signed)
Would you happen to know where this [employer] form is located

## 2019-09-10 ENCOUNTER — Telehealth: Payer: Self-pay | Admitting: Emergency Medicine

## 2019-09-10 NOTE — Telephone Encounter (Signed)
PT calling for update on FMLA forms They have a due date of 12/15  Please advise

## 2019-09-12 ENCOUNTER — Telehealth: Payer: Self-pay | Admitting: *Deleted

## 2019-09-12 NOTE — Telephone Encounter (Signed)
Spoke to patient to let him know forms he dropped off on 08/31/2019 is ready for pick up at front desk.

## 2019-09-12 NOTE — Telephone Encounter (Signed)
Pt called in to provide fax number for FMLA form  [phone] Attn: [place] Clemons  Pt cb for pick up - [phone]

## 2019-10-09 NOTE — Telephone Encounter (Signed)
Patient was contacted on 09/12/2019 to pick up completed FMLA forms at the front desk. See telephone message and did the patient drop off another FMLA form?

## 2019-12-17 ENCOUNTER — Encounter: Payer: Self-pay | Admitting: Emergency Medicine

## 2019-12-18 ENCOUNTER — Other Ambulatory Visit: Payer: Self-pay | Admitting: Emergency Medicine

## 2019-12-18 ENCOUNTER — Encounter: Payer: Self-pay | Admitting: Emergency Medicine

## 2019-12-18 ENCOUNTER — Other Ambulatory Visit: Payer: Self-pay

## 2019-12-18 ENCOUNTER — Telehealth: Payer: Self-pay | Admitting: Emergency Medicine

## 2019-12-18 ENCOUNTER — Telehealth (INDEPENDENT_AMBULATORY_CARE_PROVIDER_SITE_OTHER): Payer: BC Managed Care – PPO | Admitting: Emergency Medicine

## 2019-12-18 ENCOUNTER — Telehealth: Payer: Self-pay

## 2019-12-18 DIAGNOSIS — N529 Male erectile dysfunction, unspecified: Secondary | ICD-10-CM

## 2019-12-18 MED ORDER — TADALAFIL 20 MG PO TABS: 10.0000 mg | ORAL_TABLET | ORAL | 11 refills | Status: DC | PRN

## 2019-12-18 NOTE — Telephone Encounter (Signed)
See pharmacy request regarding med refill, Pharmacy comment: Alternative Requested:MED NOT COVERED.   All Pharmacy Suggested Alternatives:   0 vardenafil (LEVITRA) 20 MG tablet 0 sildenafil (VIAGRA) 100 MG tablet  To prescribe one of the alternatives listed above, open the encounter and click

## 2019-12-18 NOTE — Telephone Encounter (Signed)
Please Advise

## 2019-12-18 NOTE — Progress Notes (Signed)
Telemedicine Encounter- SOAP NOTE Established Patient  This telephone encounter was conducted with the patient's (or proxy's) verbal consent via audio telecommunications: yes/no: Yes Patient was instructed to have this encounter in a suitably private space; and to only have persons present to whom they give permission to participate. In addition, patient identity was confirmed by use of name plus two identifiers (DOB and address).  I discussed the limitations, risks, security and privacy concerns of performing an evaluation and management service by telephone and the availability of in person appointments. I also discussed with the patient that there may be a patient responsible charge related to this service. The patient expressed understanding and agreed to proceed.  I spent a total of 10 minutes talking with the patient or their proxy.  Chief Complaint  Patient presents with  . Medication Refill    tadalafil    Subjective   Taylor Morton is a 64 y.o. male established patient. Telephone visit today for medication refill. Requesting Cialis. Has scheduled appointment for physical next month.  HPI   Patient Active Problem List   Diagnosis Date Noted  . Laryngitis 09/04/2018  . Cough 03/17/2018  . Sore throat 03/17/2018  . Lower respiratory infection 03/17/2018  . Coronary artery calcification 02/28/2017  . Asthma     Past Medical History:  Diagnosis Date  . Asthma    dust  . Bradycardia   . Chest pain     Current Outpatient Medications  Medication Sig Dispense Refill  . tadalafil (ADCIRCA/CIALIS) 20 MG tablet Take 0.5-1 tablets (10-20 mg total) by mouth every other day as needed for erectile dysfunction. 5 tablet 11  . albuterol (VENTOLIN HFA) 108 (90 Base) MCG/ACT inhaler INHALE 1-2 PUFFS INTO THE LUNGS EVERY 6 (SIX) HOURS AS NEEDED FOR WHEEZING OR SHORTNESS OF BREATH. (Patient not taking: Reported on 12/18/2019) 8.5 Inhaler 0  . aspirin EC 81 MG tablet Take 1 tablet  (81 mg total) by mouth daily. (Patient not taking: Reported on 10/04/2018) 365 tablet 0  . atorvastatin (LIPITOR) 20 MG tablet Take 1 tablet (20 mg total) by mouth daily. (Patient not taking: Reported on 10/04/2018) 90 tablet 3  . azithromycin (ZITHROMAX) 250 MG tablet Sig as indicated (Patient not taking: Reported on 10/04/2018) 6 tablet 0  . IBUPROFEN PO Take by mouth as needed.    Marland Kitchen lisinopril (PRINIVIL,ZESTRIL) 10 MG tablet Take 1 tablet (10 mg total) by mouth daily. (Patient not taking: Reported on 10/04/2018) 90 tablet 3   No current facility-administered medications for this visit.    Allergies  Allergen Reactions  . Dust Mite Extract     Social History   Socioeconomic History  . Marital status: Married    Spouse name: Taylor Morton  . Number of children: 2  . Years of education: 7  . Highest education level: Not on file  Occupational History  . 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[employer]    Comment: [employer]  Tobacco Use  . Smoking status: Former Games developer  . Smokeless tobacco: Never Used  Substance and Sexual Activity  . Alcohol use: No  . Drug use: No  . Sexual activity: Yes  Other Topics Concern  . Not on file  Social History Narrative   Lives with his wife.  Sons are both in college.   Social Determinants of Health   Financial Resource Strain:   . Difficulty of Paying Living Expenses:   Food Insecurity:   . Worried  About Running Out of Food in the Last Year:   . Bee in the Last Year:   Transportation Needs:   . Lack of Transportation (Medical):   Marland Kitchen Lack of Transportation (Non-Medical):   Physical Activity:   . Days of Exercise per Week:   . Minutes of Exercise per Session:   Stress:   . Feeling of Stress :   Social Connections:   . Frequency of Communication with Friends and Family:   . Frequency of Social Gatherings with Friends and Family:   . Attends Religious Services:   . Active Member of Clubs or  Organizations:   . Attends Archivist Meetings:   Marland Kitchen Marital Status:   Intimate Partner Violence:   . Fear of Current or Ex-Partner:   . Emotionally Abused:   Marland Kitchen Physically Abused:   . Sexually Abused:     Review of Systems  Constitutional: Negative.  Negative for chills.  HENT: Negative.  Negative for congestion and sore throat.   Respiratory: Negative.  Negative for cough and shortness of breath.   Cardiovascular: Negative for chest pain and palpitations.  Gastrointestinal: Negative.  Negative for abdominal pain, diarrhea, nausea and vomiting.  Neurological: Negative.  Negative for dizziness and headaches.  All other systems reviewed and are negative.   Objective  Alert and oriented x3 in no apparent respiratory distress Vitals as reported by the patient: There were no vitals filed for this visit.  [name] was seen today for medication refill.  Diagnoses and all orders for this visit:  Erectile dysfunction, unspecified erectile dysfunction type -     tadalafil (CIALIS) 20 MG tablet; Take 0.5-1 tablets (10-20 mg total) by mouth every other day as needed for erectile dysfunction.      I discussed the assessment and treatment plan with the patient. The patient was provided an opportunity to ask questions and all were answered. The patient agreed with the plan and demonstrated an understanding of the instructions.   The patient was advised to call back or seek an in-person evaluation if the symptoms worsen or if the condition fails to improve as anticipated.  I provided 15 minutes of non-face-to-face time during this encounter.  Horald Pollen, MD  Primary Care at [facility]

## 2019-12-18 NOTE — Telephone Encounter (Signed)
Patient came into the office to drop off FMLA /SHORT TERM DISABILITY  Forms to fill out .  They have been placed in the CMA box at the nurse station

## 2019-12-18 NOTE — Patient Instructions (Signed)
° ° ° °  If you have lab work done today you will be contacted with your lab results within the next 2 weeks.  If you have not heard from us then please contact us. The fastest way to get your results is to register for My Chart. ° ° °IF you received an x-ray today, you will receive an invoice from [facility]. Please contact [facility] at [phone] with questions or concerns regarding your invoice.  ° °IF you received labwork today, you will receive an invoice from [facility]. Please contact [facility] at [phone] with questions or concerns regarding your invoice.  ° °Our billing staff will not be able to assist you with questions regarding bills from these companies. ° °You will be contacted with the lab results as soon as they are available. The fastest way to get your results is to activate your My Chart account. Instructions are located on the last page of this paperwork. If you have not heard from us regarding the results in 2 weeks, please contact this office. °  ° ° ° °

## 2019-12-19 NOTE — Telephone Encounter (Signed)
Spoke to patient about FMLA forms dropped off to be completed. Per patient form 703 is to be completed, other forms attached for reference. Call patient when form is completed.

## 2019-12-19 NOTE — Telephone Encounter (Signed)
No additional documentation needed.

## 2019-12-20 ENCOUNTER — Telehealth: Payer: Self-pay | Admitting: *Deleted

## 2019-12-20 NOTE — Telephone Encounter (Signed)
Called patient to pick up form disability, per patient come tomorrow.

## 2020-01-08 NOTE — Telephone Encounter (Signed)
No additional notes

## 2020-01-16 ENCOUNTER — Other Ambulatory Visit: Payer: Self-pay

## 2020-01-16 ENCOUNTER — Ambulatory Visit (INDEPENDENT_AMBULATORY_CARE_PROVIDER_SITE_OTHER): Payer: BC Managed Care – PPO

## 2020-01-16 ENCOUNTER — Ambulatory Visit (INDEPENDENT_AMBULATORY_CARE_PROVIDER_SITE_OTHER): Payer: BC Managed Care – PPO | Admitting: Emergency Medicine

## 2020-01-16 ENCOUNTER — Encounter: Payer: Self-pay | Admitting: Emergency Medicine

## 2020-01-16 VITALS — BP 138/86 | HR 60 | Temp 98.2°F | Resp 14 | Ht 73.0 in | Wt 193.4 lb

## 2020-01-16 DIAGNOSIS — G8929 Other chronic pain: Secondary | ICD-10-CM

## 2020-01-16 DIAGNOSIS — Z1329 Encounter for screening for other suspected endocrine disorder: Secondary | ICD-10-CM

## 2020-01-16 DIAGNOSIS — Z87891 Personal history of nicotine dependence: Secondary | ICD-10-CM

## 2020-01-16 DIAGNOSIS — Z0000 Encounter for general adult medical examination without abnormal findings: Secondary | ICD-10-CM

## 2020-01-16 DIAGNOSIS — R42 Dizziness and giddiness: Secondary | ICD-10-CM

## 2020-01-16 DIAGNOSIS — Z130 Encounter for screening for diseases of the blood and blood-forming organs and certain disorders involving the immune mechanism: Secondary | ICD-10-CM | POA: Diagnosis not present

## 2020-01-16 DIAGNOSIS — Z13220 Encounter for screening for lipoid disorders: Secondary | ICD-10-CM

## 2020-01-16 DIAGNOSIS — Z0001 Encounter for general adult medical examination with abnormal findings: Secondary | ICD-10-CM

## 2020-01-16 DIAGNOSIS — Z13228 Encounter for screening for other metabolic disorders: Secondary | ICD-10-CM

## 2020-01-16 DIAGNOSIS — Z125 Encounter for screening for malignant neoplasm of prostate: Secondary | ICD-10-CM

## 2020-01-16 DIAGNOSIS — M25512 Pain in left shoulder: Secondary | ICD-10-CM

## 2020-01-16 DIAGNOSIS — Z1211 Encounter for screening for malignant neoplasm of colon: Secondary | ICD-10-CM

## 2020-01-16 LAB — LIPID PANEL

## 2020-01-16 LAB — CMP14+EGFR

## 2020-01-16 NOTE — Patient Instructions (Addendum)
   If you have lab work done today you will be contacted with your lab results within the next 2 weeks.  If you have not heard from us then please contact us. The fastest way to get your results is to register for My Chart.   IF you received an x-ray today, you will receive an invoice from [facility]. Please contact [facility] at [phone] with questions or concerns regarding your invoice.   IF you received labwork today, you will receive an invoice from [facility]. Please contact [facility] at [phone] with questions or concerns regarding your invoice.   Our billing staff will not be able to assist you with questions regarding bills from these companies.  You will be contacted with the lab results as soon as they are available. The fastest way to get your results is to activate your My Chart account. Instructions are located on the last page of this paperwork. If you have not heard from us regarding the results in 2 weeks, please contact this office.      Health Maintenance, Male Adopting a healthy lifestyle and getting preventive care are important in promoting health and wellness. Ask your health care provider about:  The right schedule for you to have regular tests and exams.  Things you can do on your own to prevent diseases and keep yourself healthy. What should I know about diet, weight, and exercise? Eat a healthy diet   Eat a diet that includes plenty of vegetables, fruits, low-fat dairy products, and lean protein.  Do not eat a lot of foods that are high in solid fats, added sugars, or sodium. Maintain a healthy weight Body mass index (BMI) is a measurement that can be used to identify possible weight problems. It estimates body fat based on height and weight. Your health care provider can help determine your BMI and help you achieve or maintain a healthy weight. Get regular exercise Get regular exercise. This is one of the most important things you  can do for your health. Most adults should:  Exercise for at least 150 minutes each week. The exercise should increase your heart rate and make you sweat (moderate-intensity exercise).  Do strengthening exercises at least twice a week. This is in addition to the moderate-intensity exercise.  Spend less time sitting. Even light physical activity can be beneficial. Watch cholesterol and blood lipids Have your blood tested for lipids and cholesterol at 65 years of age, then have this test every 5 years. You may need to have your cholesterol levels checked more often if:  Your lipid or cholesterol levels are high.  You are older than 65 years of age.  You are at high risk for heart disease. What should I know about cancer screening? Many types of cancers can be detected early and may often be prevented. Depending on your health history and family history, you may need to have cancer screening at various ages. This may include screening for:  Colorectal cancer.  Prostate cancer.  Skin cancer.  Lung cancer. What should I know about heart disease, diabetes, and high blood pressure? Blood pressure and heart disease  High blood pressure causes heart disease and increases the risk of stroke. This is more likely to develop in people who have high blood pressure readings, are of African descent, or are overweight.  Talk with your health care provider about your target blood pressure readings.  Have your blood pressure checked: ? Every 3-5 years if you are 18-39   years of age. ? Every year if you are 40 years old or older.  If you are between the ages of 65 and 75 and are a current or former smoker, ask your health care provider if you should have a one-time screening for abdominal aortic aneurysm (AAA). Diabetes Have regular diabetes screenings. This checks your fasting blood sugar level. Have the screening done:  Once every three years after age 45 if you are at a normal weight and have  a low risk for diabetes.  More often and at a younger age if you are overweight or have a high risk for diabetes. What should I know about preventing infection? Hepatitis B If you have a higher risk for hepatitis B, you should be screened for this virus. Talk with your health care provider to find out if you are at risk for hepatitis B infection. Hepatitis C Blood testing is recommended for:  Everyone born from 1945 through 1965.  Anyone with known risk factors for hepatitis C. Sexually transmitted infections (STIs)  You should be screened each year for STIs, including gonorrhea and chlamydia, if: ? You are sexually active and are younger than 65 years of age. ? You are older than 65 years of age and your health care provider tells you that you are at risk for this type of infection. ? Your sexual activity has changed since you were last screened, and you are at increased risk for chlamydia or gonorrhea. Ask your health care provider if you are at risk.  Ask your health care provider about whether you are at high risk for HIV. Your health care provider may recommend a prescription medicine to help prevent HIV infection. If you choose to take medicine to prevent HIV, you should first get tested for HIV. You should then be tested every 3 months for as long as you are taking the medicine. Follow these instructions at home: Lifestyle  Do not use any products that contain nicotine or tobacco, such as cigarettes, e-cigarettes, and chewing tobacco. If you need help quitting, ask your health care provider.  Do not use street drugs.  Do not share needles.  Ask your health care provider for help if you need support or information about quitting drugs. Alcohol use  Do not drink alcohol if your health care provider tells you not to drink.  If you drink alcohol: ? Limit how much you have to 0-2 drinks a day. ? Be aware of how much alcohol is in your drink. In the [place] one drink equals one 12  oz bottle of beer (355 mL), one 5 oz glass of wine (148 mL), or one 1 oz glass of hard liquor (44 mL). General instructions  Schedule regular health, dental, and eye exams.  Stay current with your vaccines.  Tell your health care provider if: ? You often feel depressed. ? You have ever been abused or do not feel safe at home. Summary  Adopting a healthy lifestyle and getting preventive care are important in promoting health and wellness.  Follow your health care provider's instructions about healthy diet, exercising, and getting tested or screened for diseases.  Follow your health care provider's instructions on monitoring your cholesterol and blood pressure. This information is not intended to replace advice given to you by your health care provider. Make sure you discuss any questions you have with your health care provider. Document Revised: 09/06/2018 Document Reviewed: 09/06/2018 Elsevier Patient Education  2020 [employer]  

## 2020-01-16 NOTE — Progress Notes (Signed)
Taylor Morton 64 y.o.   Chief Complaint  Patient presents with  . Annual Exam    struggles with vertigo on occasion usually last the whole day, hvaing some shoulder pain, otherwise doing well    HISTORY OF PRESENT ILLNESS: This is a 64 y.o. male here for annual exam. Healthy male with a healthy lifestyle. Quit smoking 2 months ago. Exercises regularly.  Plays tennis every day. Good healthy nutrition. Vertigo on and off for 2 weeks. Chronic left shoulder pain for 2 months. Otherwise no complaints or medical concerns. Health maintenance issues discussed with patient.  Needs colonoscopy referral.  HPI   Prior to Admission medications   Medication Sig Start Date End Date Taking? Authorizing Provider  IBUPROFEN PO Take by mouth as needed.   Yes [provider]  tadalafil (CIALIS) 20 MG tablet Take 0.5-1 tablets (10-20 mg total) by mouth every other day as needed for erectile dysfunction. 12/18/19  Yes Horald Pollen, MD    Allergies  Allergen Reactions  . Dust Mite Extract     Patient Active Problem List   Diagnosis Date Noted  . Coronary artery calcification 02/28/2017  . Asthma     Past Medical History:  Diagnosis Date  . Asthma    dust  . Bradycardia   . Chest pain   . COPD (chronic obstructive pulmonary disease) ([place])     Past Surgical History:  Procedure Laterality Date  . APPENDECTOMY      Social History   Socioeconomic History  . Marital status: Married    Spouse name: Hinton Dyer  . Number of children: 2  . Years of education: 19  . Highest education level: Not on file  Occupational History  . 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 Smoking status: Former Research scientist (life sciences)  . Smokeless tobacco: Never Used  Substance and Sexual Activity  . Alcohol use: Yes    Comment: 6/week  . Drug use: No  . Sexual activity: Yes  Other Topics Concern  . Not on file   Social History Narrative   Lives with his wife.  Sons are both in college.   Social Determinants of Health   Financial Resource Strain:   . Difficulty of Paying Living Expenses:   Food Insecurity:   . Worried About Charity fundraiser in the Last Year:   . Arboriculturist in the Last Year:   Transportation Needs:   . Film/video editor (Medical):   Marland Kitchen Lack of Transportation (Non-Medical):   Physical Activity:   . Days of Exercise per Week:   . Minutes of Exercise per Session:   Stress:   . Feeling of Stress :   Social Connections:   . Frequency of Communication with Friends and Family:   . Frequency of Social Gatherings with Friends and Family:   . Attends Religious Services:   . Active Member of Clubs or Organizations:   . Attends Archivist Meetings:   Marland Kitchen Marital Status:   Intimate Partner Violence:   . Fear of Current or Ex-Partner:   . Emotionally Abused:   Marland Kitchen Physically Abused:   . Sexually Abused:     Family History  Problem Relation Age of Onset  . Cancer Father   . Arthritis Mother      Review of Systems  Constitutional: Negative.  Negative for chills and fever.  HENT: Negative.  Negative  for congestion, ear discharge, ear pain and sore throat.        Vertigo  Respiratory: Negative.  Negative for cough and shortness of breath.   Cardiovascular: Negative.  Negative for chest pain and palpitations.  Gastrointestinal: Negative.  Negative for abdominal pain, diarrhea, nausea and vomiting.  Genitourinary: Negative.  Negative for dysuria and hematuria.  Musculoskeletal: Positive for joint pain (Chronic left shoulder pain).  Skin: Negative.  Negative for rash.  Neurological: Negative.  Negative for dizziness and headaches.  Endo/Heme/Allergies: Negative.   All other systems reviewed and are negative.  Today's Vitals   01/16/20 1050 01/16/20 1056  BP: (!) 169/82 138/86  Pulse: 60   Resp: 14   Temp: 98.2 F (36.8 C)   TempSrc: Temporal   SpO2:  97%   Weight: 193 lb 6.4 oz (87.7 kg)   Height: '6\' 1"'  (1.854 m)    Body mass index is 25.52 kg/m.   Physical Exam Vitals reviewed.  Constitutional:      Appearance: Normal appearance.  HENT:     Head: Normocephalic.     Right Ear: Tympanic membrane, ear canal and external ear normal.     Left Ear: Tympanic membrane, ear canal and external ear normal.     Mouth/Throat:     Mouth: Mucous membranes are moist.     Pharynx: Oropharynx is clear.  Eyes:     Extraocular Movements: Extraocular movements intact.     Conjunctiva/sclera: Conjunctivae normal.     Pupils: Pupils are equal, round, and reactive to light.  Neck:     Vascular: No carotid bruit.  Cardiovascular:     Rate and Rhythm: Normal rate and regular rhythm.     Pulses: Normal pulses.     Heart sounds: Normal heart sounds.  Pulmonary:     Effort: Pulmonary effort is normal.     Breath sounds: Normal breath sounds.  Abdominal:     General: Bowel sounds are normal. There is no distension.     Palpations: Abdomen is soft. There is no mass.     Tenderness: There is no abdominal tenderness.  Musculoskeletal:     Cervical back: Normal range of motion and neck supple. No tenderness.     Right lower leg: No edema.     Left lower leg: No edema.     Comments: Left shoulder: Limited range of motion  Lymphadenopathy:     Cervical: No cervical adenopathy.  Skin:    General: Skin is warm and dry.     Capillary Refill: Capillary refill takes less than 2 seconds.  Neurological:     General: No focal deficit present.     Mental Status: He is alert and oriented to person, place, and time.  Psychiatric:        Mood and Affect: Mood normal.        Behavior: Behavior normal.      ASSESSMENT & PLAN: Darryle was seen today for annual exam.  Diagnoses and all orders for this visit:  Routine general medical examination at a health care facility  History of smoking -     DG Chest 2 View  Colon cancer screening -      Ambulatory referral to Gastroenterology  Screening for deficiency anemia -     CBC with Differential/Platelet  Screening for lipoid disorders -     Lipid panel  Screening for endocrine, metabolic and immunity disorder -     CMP14+EGFR -     TSH  Prostate  cancer screening -     PSA  Chronic left shoulder pain -     Ambulatory referral to Orthopedic Surgery  Vertigo    Patient Instructions       If you have lab work done today you will be contacted with your lab results within the next 2 weeks.  If you have not heard from [place] then please contact us. The fastest way to get your results is to register for My Chart.   IF you received an x-ray today, you will receive an invoice from [facility]. Please contact [facility] at [phone] with questions or concerns regarding your invoice.   IF you received labwork today, you will receive an invoice from [place]. Please contact [facility] at [phone] with questions or concerns regarding your invoice.   Our billing staff will not be able to assist you with questions regarding bills from these companies.  You will be contacted with the lab results as soon as they are available. The fastest way to get your results is to activate your My Chart account. Instructions are located on the last page of this paperwork. If you have not heard from [place] regarding the results in 2 weeks, please contact this office.      Health Maintenance, Male Adopting a healthy lifestyle and getting preventive care are important in promoting health and wellness. Ask your health care provider about:  The right schedule for you to have regular tests and exams.  Things you can do on your own to prevent diseases and keep yourself healthy. What should I know about diet, weight, and exercise? Eat a healthy diet   Eat a diet that includes plenty of vegetables, fruits, low-fat dairy products, and lean protein.  Do not eat a lot of foods  that are high in solid fats, added sugars, or sodium. Maintain a healthy weight Body mass index (BMI) is a measurement that can be used to identify possible weight problems. It estimates body fat based on height and weight. Your health care provider can help determine your BMI and help you achieve or maintain a healthy weight. Get regular exercise Get regular exercise. This is one of the most important things you can do for your health. Most adults should:  Exercise for at least 150 minutes each week. The exercise should increase your heart rate and make you sweat (moderate-intensity exercise).  Do strengthening exercises at least twice a week. This is in addition to the moderate-intensity exercise.  Spend less time sitting. Even light physical activity can be beneficial. Watch cholesterol and blood lipids Have your blood tested for lipids and cholesterol at 65 years of age, then have this test every 5 years. You may need to have your cholesterol levels checked more often if:  Your lipid or cholesterol levels are high.  You are older than 65 years of age.  You are at high risk for heart disease. What should I know about cancer screening? Many types of cancers can be detected early and may often be prevented. Depending on your health history and family history, you may need to have cancer screening at various ages. This may include screening for:  Colorectal cancer.  Prostate cancer.  Skin cancer.  Lung cancer. What should I know about heart disease, diabetes, and high blood pressure? Blood pressure and heart disease  High blood pressure causes heart disease and increases the risk of stroke. This is more likely to develop in people who have high blood pressure readings, are  of African descent, or are overweight.  Talk with your health care provider about your target blood pressure readings.  Have your blood pressure checked: ? Every 3-5 years if you are 82-41 years of age. ?  Every year if you are 64 years old or older.  If you are between the ages of 56 and 28 and are a current or former smoker, ask your health care provider if you should have a one-time screening for abdominal aortic aneurysm (AAA). Diabetes Have regular diabetes screenings. This checks your fasting blood sugar level. Have the screening done:  Once every three years after age 41 if you are at a normal weight and have a low risk for diabetes.  More often and at a younger age if you are overweight or have a high risk for diabetes. What should I know about preventing infection? Hepatitis B If you have a higher risk for hepatitis B, you should be screened for this virus. Talk with your health care provider to find out if you are at risk for hepatitis B infection. Hepatitis C Blood testing is recommended for:  Everyone born from [birth year] through 1966.  Anyone with known risk factors for hepatitis C. Sexually transmitted infections (STIs)  You should be screened each year for STIs, including gonorrhea and chlamydia, if: ? You are sexually active and are younger than 65 years of age. ? You are older than 65 years of age and your health care provider tells you that you are at risk for this type of infection. ? Your sexual activity has changed since you were last screened, and you are at increased risk for chlamydia or gonorrhea. Ask your health care provider if you are at risk.  Ask your health care provider about whether you are at high risk for HIV. Your health care provider may recommend a prescription medicine to help prevent HIV infection. If you choose to take medicine to prevent HIV, you should first get tested for HIV. You should then be tested every 3 months for as long as you are taking the medicine. Follow these instructions at home: Lifestyle  Do not use any products that contain nicotine or tobacco, such as cigarettes, e-cigarettes, and chewing tobacco. If you need help quitting, ask your  health care provider.  Do not use street drugs.  Do not share needles.  Ask your health care provider for help if you need support or information about quitting drugs. Alcohol use  Do not drink alcohol if your health care provider tells you not to drink.  If you drink alcohol: ? Limit how much you have to 0-2 drinks a day. ? Be aware of how much alcohol is in your drink. In the [place] one drink equals one 12 oz bottle of beer (355 mL), one 5 oz glass of wine (148 mL), or one 1 oz glass of hard liquor (44 mL). General instructions  Schedule regular health, dental, and eye exams.  Stay current with your vaccines.  Tell your health care provider if: ? You often feel depressed. ? You have ever been abused or do not feel safe at home. Summary  Adopting a healthy lifestyle and getting preventive care are important in promoting health and wellness.  Follow your health care provider's instructions about healthy diet, exercising, and getting tested or screened for diseases.  Follow your health care provider's instructions on monitoring your cholesterol and blood pressure. This information is not intended to replace advice given to you by your health care  provider. Make sure you discuss any questions you have with your health care provider. Document Revised: 09/06/2018 Document Reviewed: 09/06/2018 Elsevier Patient Education  2020 Elsevier Inc.      Agustina Caroli, MD [facility]

## 2020-01-17 ENCOUNTER — Encounter: Payer: Self-pay | Admitting: Emergency Medicine

## 2020-01-17 ENCOUNTER — Encounter: Payer: Self-pay | Admitting: Gastroenterology

## 2020-01-17 LAB — CBC WITH DIFFERENTIAL/PLATELET
Basophils Absolute: 0 10*3/uL (ref 0.0–0.2)
Basos: 1 %
EOS (ABSOLUTE): 0.1 10*3/uL (ref 0.0–0.4)
Eos: 1 %
Hematocrit: 42.6 % (ref 37.5–51.0)
Hemoglobin: 14.5 g/dL (ref 13.0–17.7)
Immature Grans (Abs): 0 10*3/uL (ref 0.0–0.1)
Immature Granulocytes: 0 %
Lymphocytes Absolute: 1.1 10*3/uL (ref 0.7–3.1)
Lymphs: 24 %
MCH: 31.8 pg (ref 26.6–33.0)
MCHC: 34 g/dL (ref 31.5–35.7)
MCV: 93 fL (ref 79–97)
Monocytes Absolute: 0.4 10*3/uL (ref 0.1–0.9)
Monocytes: 8 %
Neutrophils Absolute: 2.9 10*3/uL (ref 1.4–7.0)
Neutrophils: 66 %
Platelets: 249 10*3/uL (ref 150–450)
RBC: 4.56 x10E6/uL (ref 4.14–5.80)
RDW: 13 % (ref 11.6–15.4)
WBC: 4.4 10*3/uL (ref 3.4–10.8)

## 2020-01-17 LAB — CMP14+EGFR
ALT: 13 IU/L (ref 0–44)
AST: 22 IU/L (ref 0–40)
Albumin/Globulin Ratio: 2.2 (ref 1.2–2.2)
Albumin: 4.6 g/dL (ref 3.8–4.8)
Alkaline Phosphatase: 90 IU/L (ref 39–117)
BUN/Creatinine Ratio: 15 (ref 10–24)
BUN: 12 mg/dL (ref 8–27)
Bilirubin Total: 1.1 mg/dL (ref 0.0–1.2)
CO2: 23 mmol/L (ref 20–29)
Calcium: 9.4 mg/dL (ref 8.6–10.2)
Chloride: 102 mmol/L (ref 96–106)
Creatinine, Ser: 0.8 mg/dL (ref 0.76–1.27)
GFR calc non Af Amer: 94 mL/min/{1.73_m2} (ref >59)
Globulin, Total: 2.1 g/dL (ref 1.5–4.5)
Glucose: 88 mg/dL (ref 65–99)
Potassium: 4.6 mmol/L (ref 3.5–5.2)
Sodium: 137 mmol/L (ref 134–144)
Total Protein: 6.7 g/dL (ref 6.0–8.5)

## 2020-01-17 LAB — LIPID PANEL
Cholesterol, Total: 194 mg/dL (ref 100–199)
HDL: 53 mg/dL (ref >39)
Triglycerides: 89 mg/dL (ref 0–149)
VLDL Cholesterol Cal: 16 mg/dL (ref 5–40)

## 2020-01-17 LAB — TSH: TSH: 4.93 u[IU]/mL — ABNORMAL HIGH (ref 0.450–4.500)

## 2020-01-17 LAB — PSA: Prostate Specific Ag, Serum: 2.4 ng/mL (ref 0.0–4.0)

## 2020-01-17 NOTE — Telephone Encounter (Signed)
Diet and exercise for now.  We will repeat TSH in the next 6 to 12 months. Foods to eat: Antioxidant-rich foods: e.g. Blueberries, tomatoes, bell peppers, whole grains Foods containing selenium: e.g. sunflower seeds, [place] nuts Foods containing tyrosine: e.g. meats, dairy, and legumes  Foods to avoid: Soy Iodine-rich foods: e.g. iodized salt Cruciferous vegetables: e.g. broccoli, cabbage, spinach, kale, and brussel sprouts Caffeine, Alcohol

## 2020-01-17 NOTE — Telephone Encounter (Signed)
Pt would like to know if theres anything he can do to improve his TSH levels considering they're not bad enough for medication yet.

## 2020-01-21 ENCOUNTER — Ambulatory Visit: Payer: BC Managed Care – PPO | Admitting: Orthopedic Surgery

## 2020-01-21 ENCOUNTER — Ambulatory Visit: Payer: Self-pay

## 2020-01-21 ENCOUNTER — Encounter: Payer: Self-pay | Admitting: Orthopedic Surgery

## 2020-01-21 ENCOUNTER — Other Ambulatory Visit: Payer: Self-pay

## 2020-01-21 VITALS — Ht 73.0 in | Wt 193.0 lb

## 2020-01-21 DIAGNOSIS — M25512 Pain in left shoulder: Secondary | ICD-10-CM | POA: Diagnosis not present

## 2020-01-21 NOTE — Progress Notes (Signed)
Office Visit Note   Patient: Taylor Morton           Date of Birth: 12/14/1955           MRN: [id number] Visit Date: 01/21/2020              Requested by: Horald Pollen, MD [place],  [place] [id number] PCP: Horald Pollen, MD  Chief Complaint  Patient presents with  . Left Shoulder - Pain      HPI: Patient is a 64 year old gentleman who presents with left shoulder pain over the deltoid.  He denies any injury states he has decreased range of motion and pain with range of motion he states this has been going on for 1 to 2 months.  He states he cannot sleep on the left side.  He denies any radicular symptoms.  He states that ice and ibuprofen help a little.  Assessment & Plan: Visit Diagnoses:  1. Acute pain of left shoulder     Plan: Discussed  Follow-Up Instructions: Return if symptoms worsen or fail to improve.   Ortho Exam  Patient is alert, oriented, no adenopathy, well-dressed, normal affect, normal respiratory effort. Examination patient has decreased abduction of flexion on the left side compared to the right of about 20 degrees.  He has internal and external rotation of about 45 degrees with pain with Neer and Hawkins impingement test pain with a drop arm test the biceps tendon is minimally tender to palpation  Imaging: XR Shoulder Left  Result Date: 01/21/2020 2 view radiographs of the left shoulder shows a clear lung field there is a little bit of superior migration of the humeral head within the glenoid.  There is no bony spurs of the AC joint.  No images are attached to the encounter.  Labs: Lab Results  Component Value Date   HGBA1C 5.2 02/28/2017  The AC joint is nontender to palpation. Exercises to include internal and e is he does not want an injection and will use ibuprofen 600 to 800 mg with meals 3 times a day.  Patient states he will follow-up if he fails treatment with oral anti-inflammatories and would like to proceed with an  injection.  Xternal rotation as well as scapular stabilization he states he understands these instructions and will stay away from his overhead lifting.  Discussed option of subacromial injection versus anti-inflammatories.  Patient  Lab Results  Component Value Date   ALBUMIN 4.6 01/16/2020   ALBUMIN 5.3 (H) 10/04/2018   ALBUMIN 5.1 (H) 02/08/2017    No results found for: MG No results found for: VD25OH  No results found for: PREALBUMIN CBC EXTENDED Latest Ref Rng & Units 01/16/2020 10/04/2018 02/08/2017  WBC 3.4 - 10.8 x10E3/uL 4.4 4.1 8.1  RBC 4.14 - 5.80 x10E6/uL 4.56 4.61 5.15  HGB 13.0 - 17.7 g/dL 14.5 14.9 16.3  HCT 37.5 - 51.0 % 42.6 42.5 47.4  PLT 150 - 450 x10E3/uL 249 272 -  NEUTROABS 1.4 - 7.0 x10E3/uL 2.9 - -  LYMPHSABS 0.7 - 3.1 x10E3/uL 1.1 - -     Body mass index is 25.46 kg/m.  Orders:  Orders Placed This Encounter  Procedures  . XR Shoulder Left   No orders of the defined types were placed in this encounter.    Procedures: No procedures performed  Clinical Data: No additional findings.  ROS:  All other systems negative, except as noted in the HPI. Review of Systems  Objective: Vital Signs: Ht  6\' 1"  (1.854 m)   Wt 193 lb (87.5 kg)   BMI 25.46 kg/m   Specialty Comments:  No specialty comments available.  PMFS History: Patient Active Problem List   Diagnosis Date Noted  . Coronary artery calcification 02/28/2017  . Asthma    Past Medical History:  Diagnosis Date  . Asthma    dust  . Bradycardia   . Chest pain   . COPD (chronic obstructive pulmonary disease) ([facility])     Family History  Problem Relation Age of Onset  . Cancer Father   . Arthritis Mother     Past Surgical History:  Procedure Laterality Date  . APPENDECTOMY     Social History   Occupational History  . 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 Smoking status: Former Chief Financial Officer    . Smokeless tobacco: Never Used  Substance and Sexual Activity  . Alcohol use: Yes    Comment: 6/week  . Drug use: No  . Sexual activity: Yes

## 2020-02-18 ENCOUNTER — Other Ambulatory Visit: Payer: Self-pay

## 2020-02-18 ENCOUNTER — Ambulatory Visit (AMBULATORY_SURGERY_CENTER): Payer: Self-pay | Admitting: *Deleted

## 2020-02-18 VITALS — Ht 73.0 in | Wt 194.6 lb

## 2020-02-18 DIAGNOSIS — Z1211 Encounter for screening for malignant neoplasm of colon: Secondary | ICD-10-CM

## 2020-02-18 NOTE — Progress Notes (Signed)
Patient denies any allergies to egg or soy products. Patient denies complications with anesthesia/sedation.  Patient denies oxygen use at home and denies diet medications. Emmi instructions for colonoscopy explained and given to patient.  

## 2020-02-19 ENCOUNTER — Encounter: Payer: Self-pay | Admitting: Gastroenterology

## 2020-03-03 ENCOUNTER — Ambulatory Visit (AMBULATORY_SURGERY_CENTER): Payer: BC Managed Care – PPO | Admitting: Gastroenterology

## 2020-03-03 ENCOUNTER — Encounter: Payer: Self-pay | Admitting: Gastroenterology

## 2020-03-03 ENCOUNTER — Other Ambulatory Visit: Payer: Self-pay

## 2020-03-03 VITALS — BP 133/76 | HR 50 | Temp 97.5°F | Resp 9 | Ht 73.0 in | Wt 194.5 lb

## 2020-03-03 DIAGNOSIS — Z1211 Encounter for screening for malignant neoplasm of colon: Secondary | ICD-10-CM

## 2020-03-03 MED ORDER — SODIUM CHLORIDE 0.9 % IV SOLN: 500.0000 mL | Freq: Once | INTRAVENOUS | Status: DC

## 2020-03-03 NOTE — Progress Notes (Signed)
Pt's states no medical or surgical changes since previsit or office visit.   VS taken by DT 

## 2020-03-03 NOTE — Progress Notes (Signed)
To PACU, VSS. Report to [email or web address] 

## 2020-03-03 NOTE — Op Note (Signed)
[place] Patient Name: Taylor Morton Procedure Date: 03/03/2020 8:39 AM MRN: [id number] Endoscopist: Schuyler. Loletha Carrow , MD Age: 64 Referring MD:  Date of Birth: 02/12/1956 Gender: Male Account #: [id number] Procedure:                Colonoscopy Indications:              Screening for colorectal malignant neoplasm, This                            is the patient's first colonoscopy Medicines:                Monitored Anesthesia Care Procedure:                Pre-Anesthesia Assessment:                           - Prior to the procedure, a History and Physical                            was performed, and patient medications and                            allergies were reviewed. The patient's tolerance of                            previous anesthesia was also reviewed. The risks                            and benefits of the procedure and the sedation                            options and risks were discussed with the patient.                            All questions were answered, and informed consent                            was obtained. Prior Anticoagulants: The patient has                            taken no previous anticoagulant or antiplatelet                            agents. ASA Grade Assessment: II - A patient with                            mild systemic disease. After reviewing the risks                            and benefits, the patient was deemed in                            satisfactory condition to undergo the procedure.  After obtaining informed consent, the colonoscope                            was passed under direct vision. Throughout the                            procedure, the patient's blood pressure, pulse, and                            oxygen saturations were monitored continuously. The                            Colonoscope was introduced through the anus and                            advanced to the the terminal  ileum, with                            identification of the appendiceal orifice and IC                            valve. The colonoscopy was performed without                            difficulty. The patient tolerated the procedure                            well. The quality of the bowel preparation was                            excellent. The terminal ileum, appendiceal orifice,                            and rectum were photographed. The bowel preparation                            used was Miralax. Scope In: 8:46:32 AM Scope Out: 8:58:15 AM Scope Withdrawal Time: 0 hours 10 minutes 3 seconds  Total Procedure Duration: 0 hours 11 minutes 43 seconds  Findings:                 The perianal and digital rectal examinations were                            normal.                           A few small-mouthed diverticula were found in the                            left colon.                           The exam was otherwise without abnormality on  direct and retroflexion views. Complications:            No immediate complications. Estimated Blood Loss:     Estimated blood loss: none. Impression:               - Diverticulosis in the left colon.                           - The examination was otherwise normal on direct                            and retroflexion views.                           - No specimens collected. Recommendation:           - Patient has a contact number available for                            emergencies. The signs and symptoms of potential                            delayed complications were discussed with the                            patient. Return to normal activities tomorrow.                            Written discharge instructions were provided to the                            patient.                           - Resume previous diet.                           - Continue present medications.                           -  Repeat colonoscopy in 10 years for screening                            purposes. Xylon Croom L. Myrtie Neither, MD 03/03/2020 9:03:13 AM This report has been signed electronically.

## 2020-03-03 NOTE — Patient Instructions (Signed)
HANDOUTS PROVIDED ON: DIVERTICULOSIS  You may resume your previous diet and medication schedule.  Thank you for allowing us to care for you today!!!   YOU HAD AN ENDOSCOPIC PROCEDURE TODAY AT THE [facility]:   Refer to the procedure report that was given to you for any specific questions about what was found during the examination.  If the procedure report does not answer your questions, please call your gastroenterologist to clarify.  If you requested that your care partner not be given the details of your procedure findings, then the procedure report has been included in a sealed envelope for you to review at your convenience later.  YOU SHOULD EXPECT: Some feelings of bloating in the abdomen. Passage of more gas than usual.  Walking can help get rid of the air that was put into your GI tract during the procedure and reduce the bloating. If you had a lower endoscopy (such as a colonoscopy or flexible sigmoidoscopy) you may notice spotting of blood in your stool or on the toilet paper. If you underwent a bowel prep for your procedure, you may not have a normal bowel movement for a few days.  Please Note:  You might notice some irritation and congestion in your nose or some drainage.  This is from the oxygen used during your procedure.  There is no need for concern and it should clear up in a day or so.  SYMPTOMS TO REPORT IMMEDIATELY:   Following lower endoscopy (colonoscopy or flexible sigmoidoscopy):  Excessive amounts of blood in the stool  Significant tenderness or worsening of abdominal pains  Swelling of the abdomen that is new, acute  Fever of 100F or higher  For urgent or emergent issues, a gastroenterologist can be reached at any hour by calling [phone]. Do not use MyChart messaging for urgent concerns.    DIET:  We do recommend a small meal at first, but then you may proceed to your regular diet.  Drink plenty of fluids but you should avoid alcoholic  beverages for 24 hours.  ACTIVITY:  You should plan to take it easy for the rest of today and you should NOT DRIVE or use heavy machinery until tomorrow (because of the sedation medicines used during the test).    FOLLOW UP: Our staff will call the number listed on your records 48-72 hours following your procedure to check on you and address any questions or concerns that you may have regarding the information given to you following your procedure. If we do not reach you, we will leave a message.  We will attempt to reach you two times.  During this call, we will ask if you have developed any symptoms of COVID 19. If you develop any symptoms (ie: fever, flu-like symptoms, shortness of breath, cough etc.) before then, please call [phone].  If you test positive for Covid 19 in the 2 weeks post procedure, please call and report this information to us.    If any biopsies were taken you will be contacted by phone or by letter within the next 1-3 weeks.  Please call us at [phone] if you have not heard about the biopsies in 3 weeks.    SIGNATURES/CONFIDENTIALITY: You and/or your care partner have signed paperwork which will be entered into your electronic medical record.  These signatures attest to the fact that that the information above on your After Visit Summary has been reviewed and is understood.  Full responsibility of the confidentiality of this   discharge information lies with you and/or your care-partner. 

## 2020-03-05 ENCOUNTER — Telehealth: Payer: Self-pay

## 2020-03-05 NOTE — Telephone Encounter (Signed)
°  Follow up Call-  Call back number 03/03/2020  Post procedure Call Back phone  # [phone]  Permission to leave phone message Yes  Some recent data might be hidden     Patient questions:  Do you have a fever, pain , or abdominal swelling? No. Pain Score  0 *  Have you tolerated food without any problems? Yes.    Have you been able to return to your normal activities? Yes.    Do you have any questions about your discharge instructions: Diet   No. Medications  No. Follow up visit  No.  Do you have questions or concerns about your Care? No.  Actions: * If pain score is 4 or above: No action needed, pain <4. 1. Have you developed a fever since your procedure? no  2.   Have you had an respiratory symptoms (SOB or cough) since your procedure? no  3.   Have you tested positive for COVID 19 since your procedure no  4.   Have you had any family members/close contacts diagnosed with the COVID 19 since your procedure?  no   If yes to any of these questions please route to Laverna Peace, RN and Charlett Lango, RN

## 2020-03-11 ENCOUNTER — Encounter: Payer: Self-pay | Admitting: Registered Nurse

## 2020-03-11 ENCOUNTER — Ambulatory Visit: Payer: BC Managed Care – PPO | Admitting: Registered Nurse

## 2020-03-11 ENCOUNTER — Other Ambulatory Visit: Payer: Self-pay

## 2020-03-11 VITALS — BP 142/76 | HR 61 | Temp 98.3°F | Resp 17 | Ht 73.0 in | Wt 186.6 lb

## 2020-03-11 DIAGNOSIS — M79671 Pain in right foot: Secondary | ICD-10-CM

## 2020-03-11 NOTE — Progress Notes (Signed)
Acute Office Visit  Subjective:    Patient ID: Taylor Morton, male    DOB: [birth year], 64 y.o.   MRN: [id number]  Chief Complaint  Patient presents with  . Foot Pain    Patient states he was hiking and the next day he has some swelling and pain in the right foot. Per patient he has tried ice , heat and Ibuprofen but not much relief.    HPI Patient is in today for foot pain  Was hiking a little over one week ago and had foot pain the next day. Located on distal end of fifth metatarsal. Achy pain. Some numbness underneath. Does not recall acute injury. Has had hx of bunions but no known abnormalities with lateral edge of foot. No radiation of pain, no trouble weight bearing. Some redness but no warmth, mildly ttp  Past Medical History:  Diagnosis Date  . Asthma    caused by dust - no inhaler  . Bradycardia   . Chest pain    no current problems  . COPD (chronic obstructive pulmonary disease) ([place])     Past Surgical History:  Procedure Laterality Date  . APPENDECTOMY      Family History  Problem Relation Age of Onset  . Cancer Father   . Arthritis Mother   . Colon cancer Neg Hx   . Rectal cancer Neg Hx   . Stomach cancer Neg Hx     Social History   Socioeconomic History  . Marital status: Married    Spouse name: Hinton Dyer  . Number of children: 2  . Years of education: 27  . Highest education level: Not on file  Occupational History  . 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 Smoking status: Former Smoker    Packs/day: 1.00    Years: 10.00    Pack years: 10.00    Types: Cigarettes    Quit date: 1992    Years since quitting: 29.4  . Smokeless tobacco: Never Used  Vaping Use  . Vaping Use: Never used  Substance and Sexual Activity  . Alcohol use: Yes    Alcohol/week: 7.0 - 14.0 standard drinks    Types: 7 - 14 Standard drinks or equivalent per week    Comment: 1-2 day  beer / liquor  . Drug use: No  . Sexual activity: Yes  Other Topics Concern  . Not on file  Social History Narrative   Lives with his wife.  Sons are both in college.   Social Determinants of Health   Financial Resource Strain:   . Difficulty of Paying Living Expenses:   Food Insecurity:   . Worried About Charity fundraiser in the Last Year:   . Arboriculturist in the Last Year:   Transportation Needs:   . Film/video editor (Medical):   Marland Kitchen Lack of Transportation (Non-Medical):   Physical Activity:   . Days of Exercise per Week:   . Minutes of Exercise per Session:   Stress:   . Feeling of Stress :   Social Connections:   . Frequency of Communication with Friends and Family:   . Frequency of Social Gatherings with Friends and Family:   . Attends Religious Services:   . Active Member of Clubs or Organizations:   . Attends Archivist Meetings:   Marland Kitchen Marital Status:   Intimate Partner Violence:   .  Fear of Current or Ex-Partner:   . Emotionally Abused:   Marland Kitchen Physically Abused:   . Sexually Abused:     Outpatient Medications Prior to Visit  Medication Sig Dispense Refill  . IBUPROFEN PO Take by mouth as needed.    . tadalafil (CIALIS) 20 MG tablet Take 0.5-1 tablets (10-20 mg total) by mouth every other day as needed for erectile dysfunction. 5 tablet 11   No facility-administered medications prior to visit.    Allergies  Allergen Reactions  . Dust Mite Extract     cough    Review of Systems Per hpi, otherwise negative.      Objective:    Physical Exam Vitals and nursing note reviewed.  Constitutional:      General: He is not in acute distress.    Appearance: Normal appearance. He is normal weight. He is not ill-appearing, toxic-appearing or diaphoretic.  Cardiovascular:     Rate and Rhythm: Normal rate and regular rhythm.  Pulmonary:     Effort: Pulmonary effort is normal. No respiratory distress.  Musculoskeletal:        General: Tenderness  and signs of injury present. No swelling or deformity. Normal range of motion.     Right lower leg: No edema.     Left lower leg: No edema.  Skin:    General: Skin is warm and dry.     Coloration: Skin is not jaundiced or pale.     Findings: Bruising (fifth metatarsal of r foot) present. No erythema, lesion or rash.  Neurological:     General: No focal deficit present.     Mental Status: He is alert and oriented to person, place, and time. Mental status is at baseline.     Cranial Nerves: No cranial nerve deficit.  Psychiatric:        Mood and Affect: Mood normal.        Behavior: Behavior normal.        Thought Content: Thought content normal.        Judgment: Judgment normal.     BP (!) 142/76   Pulse 61   Temp 98.3 F (36.8 C) (Temporal)   Resp 17   Ht 6\' 1"  (1.854 m)   Wt 186 lb 9.6 oz (84.6 kg)   SpO2 97%   BMI 24.62 kg/m  Wt Readings from Last 3 Encounters:  03/11/20 186 lb 9.6 oz (84.6 kg)  03/03/20 194 lb 8 oz (88.2 kg)  02/18/20 194 lb 9.6 oz (88.3 kg)    There are no preventive care reminders to display for this patient.  There are no preventive care reminders to display for this patient.   Lab Results  Component Value Date   TSH 4.930 (H) 01/16/2020   Lab Results  Component Value Date   WBC 4.4 01/16/2020   HGB 14.5 01/16/2020   HCT 42.6 01/16/2020   MCV 93 01/16/2020   PLT 249 01/16/2020   Lab Results  Component Value Date   NA 137 01/16/2020   K 4.6 01/16/2020   CO2 23 01/16/2020   GLUCOSE 88 01/16/2020   BUN 12 01/16/2020   CREATININE 0.80 01/16/2020   BILITOT 1.1 01/16/2020   ALKPHOS 90 01/16/2020   AST 22 01/16/2020   ALT 13 01/16/2020   PROT 6.7 01/16/2020   ALBUMIN 4.6 01/16/2020   CALCIUM 9.4 01/16/2020   Lab Results  Component Value Date   CHOL 194 01/16/2020   Lab Results  Component Value Date   HDL  53 01/16/2020   Lab Results  Component Value Date   LDLCALC 125 (H) 01/16/2020   Lab Results  Component Value Date    TRIG 89 01/16/2020   Lab Results  Component Value Date   CHOLHDL 3.7 01/16/2020   Lab Results  Component Value Date   HGBA1C 5.2 02/28/2017       Assessment & Plan:   Problem List Items Addressed This Visit    None    Visit Diagnoses    Foot pain, right    -  Primary   Relevant Orders   Ambulatory referral to Podiatry       No orders of the defined types were placed in this encounter.  PLAN  Seems as likely overuse injury - maybe poor fit of shoes. I think this is low likelihood for fx. Pt declines referral to [facility] for xray, would prefer to see podiatry.  Take OTCs for pain relief  Patient encouraged to call clinic with any questions, comments, or concerns.  Janeece Agee, NP

## 2020-03-11 NOTE — Patient Instructions (Signed)
° ° ° °  If you have lab work done today you will be contacted with your lab results within the next 2 weeks.  If you have not heard from us then please contact us. The fastest way to get your results is to register for My Chart. ° ° °IF you received an x-ray today, you will receive an invoice from [facility]. Please contact [facility] at [phone] with questions or concerns regarding your invoice.  ° °IF you received labwork today, you will receive an invoice from [facility]. Please contact [facility] at [phone] with questions or concerns regarding your invoice.  ° °Our billing staff will not be able to assist you with questions regarding bills from these companies. ° °You will be contacted with the lab results as soon as they are available. The fastest way to get your results is to activate your My Chart account. Instructions are located on the last page of this paperwork. If you have not heard from us regarding the results in 2 weeks, please contact this office. °  ° ° ° °

## 2020-03-13 ENCOUNTER — Ambulatory Visit: Payer: BC Managed Care – PPO | Admitting: Podiatry

## 2020-03-20 ENCOUNTER — Other Ambulatory Visit: Payer: Self-pay

## 2020-03-20 ENCOUNTER — Ambulatory Visit (INDEPENDENT_AMBULATORY_CARE_PROVIDER_SITE_OTHER): Payer: BC Managed Care – PPO

## 2020-03-20 ENCOUNTER — Ambulatory Visit: Payer: BC Managed Care – PPO | Admitting: Podiatry

## 2020-03-20 VITALS — Temp 97.0°F

## 2020-03-20 DIAGNOSIS — M79671 Pain in right foot: Secondary | ICD-10-CM

## 2020-03-20 DIAGNOSIS — S9031XA Contusion of right foot, initial encounter: Secondary | ICD-10-CM | POA: Diagnosis not present

## 2020-03-20 MED ORDER — DICLOFENAC SODIUM 1 % EX GEL: 2.0000 g | Freq: Four times a day (QID) | CUTANEOUS | 2 refills | Status: DC

## 2020-03-25 NOTE — Progress Notes (Signed)
Subjective:   Patient ID: Taylor Morton, male   DOB: 64 y.o.   MRN: [id number]   HPI 64 year old male presents the office today for concerns of discomfort in the outside aspect the right foot with the last 2 weeks after he went hiking.  He is able to walk is getting somewhat better.  Symmetric does not have a fracture.  He has noticed spot on the skin the lateral aspect the foot where he gets discomfort.  Has tried ice and heat.  He has noticed some bruising which is localized to lateral aspect the foot.   Review of Systems  All other systems reviewed and are negative.  Past Medical History:  Diagnosis Date   Asthma    caused by dust - no inhaler   Bradycardia    Chest pain    no current problems   COPD (chronic obstructive pulmonary disease) ([facility])     Past Surgical History:  Procedure Laterality Date   APPENDECTOMY       Current Outpatient Medications:    IBUPROFEN PO, Take by mouth as needed., Disp: , Rfl:    tadalafil (CIALIS) 20 MG tablet, Take 0.5-1 tablets (10-20 mg total) by mouth every other day as needed for erectile dysfunction., Disp: 5 tablet, Rfl: 11   diclofenac Sodium (VOLTAREN) 1 % GEL, Apply 2 g topically 4 (four) times daily. Rub into affected area of foot 2 to 4 times daily, Disp: 100 g, Rfl: 2  Allergies  Allergen Reactions   Dust Mite Extract     cough       Objective:  Physical Exam  General: AAO x3, NAD  Dermatological: Skin is warm, dry and supple bilateral. Nails x 10 are well manicured; remaining integument appears unremarkable at this time. There are no open sores, no preulcerative lesions, no rash or signs of infection present.  Vascular: Dorsalis Pedis artery and Posterior Tibial artery pedal pulses are 2/4 bilateral with immedate capillary fill time. Pedal hair growth present. No varicosities and no lower extremity edema present bilateral. There is no pain with calf compression, swelling, warmth, erythema.   Neruologic: Grossly intact  via light touch bilateral.  Sensation intact with Semmes Weinstein monofilament  Musculoskeletal: Tenderness palpation of lateral aspect of foot area of what appears to be a bruise the lateral aspect midshaft fifth metatarsal.  There is localized tenderness although mild to this.  There is no other areas of discomfort.  No pain the dorsal plantar aspect of the metatarsal.  Flexor, extensor tendons appear to be intact.  Muscular strength 5/5 in all groups tested bilateral.  Gait: Unassisted, Nonantalgic.       Assessment:   Contusion right foot    Plan:  -Treatment options discussed including all alternatives, risks, and complications -Etiology of symptoms were discussed -X-rays were obtained and reviewed with the patient.  No definitive evidence of acute fracture identified today.  Recommended shoes to avoid pressure and he states that actually wearing different shoes feels better.  Voltaren gel as needed.  Ice to the area.  If not improving next couple weeks to let me know or sooner if there is any worsening or changes.  Return if symptoms worsen or fail to improve.  Vivi Barrack DPM

## 2020-04-07 ENCOUNTER — Other Ambulatory Visit: Payer: Self-pay | Admitting: Podiatry

## 2020-04-07 DIAGNOSIS — S9031XA Contusion of right foot, initial encounter: Secondary | ICD-10-CM

## 2020-04-08 IMAGING — DX DG CHEST 2V
2 series · 2 of 2 positions shown · non-contrast
Comparison: Prior chest x-ray 03/17/2018; prior CT scan of the
chest 02/22/2017

CLINICAL DATA: 62-year-old male with lower neck adenopathy and a
history of smoking.

EXAM:
CHEST - 2 VIEW

[chest pa]
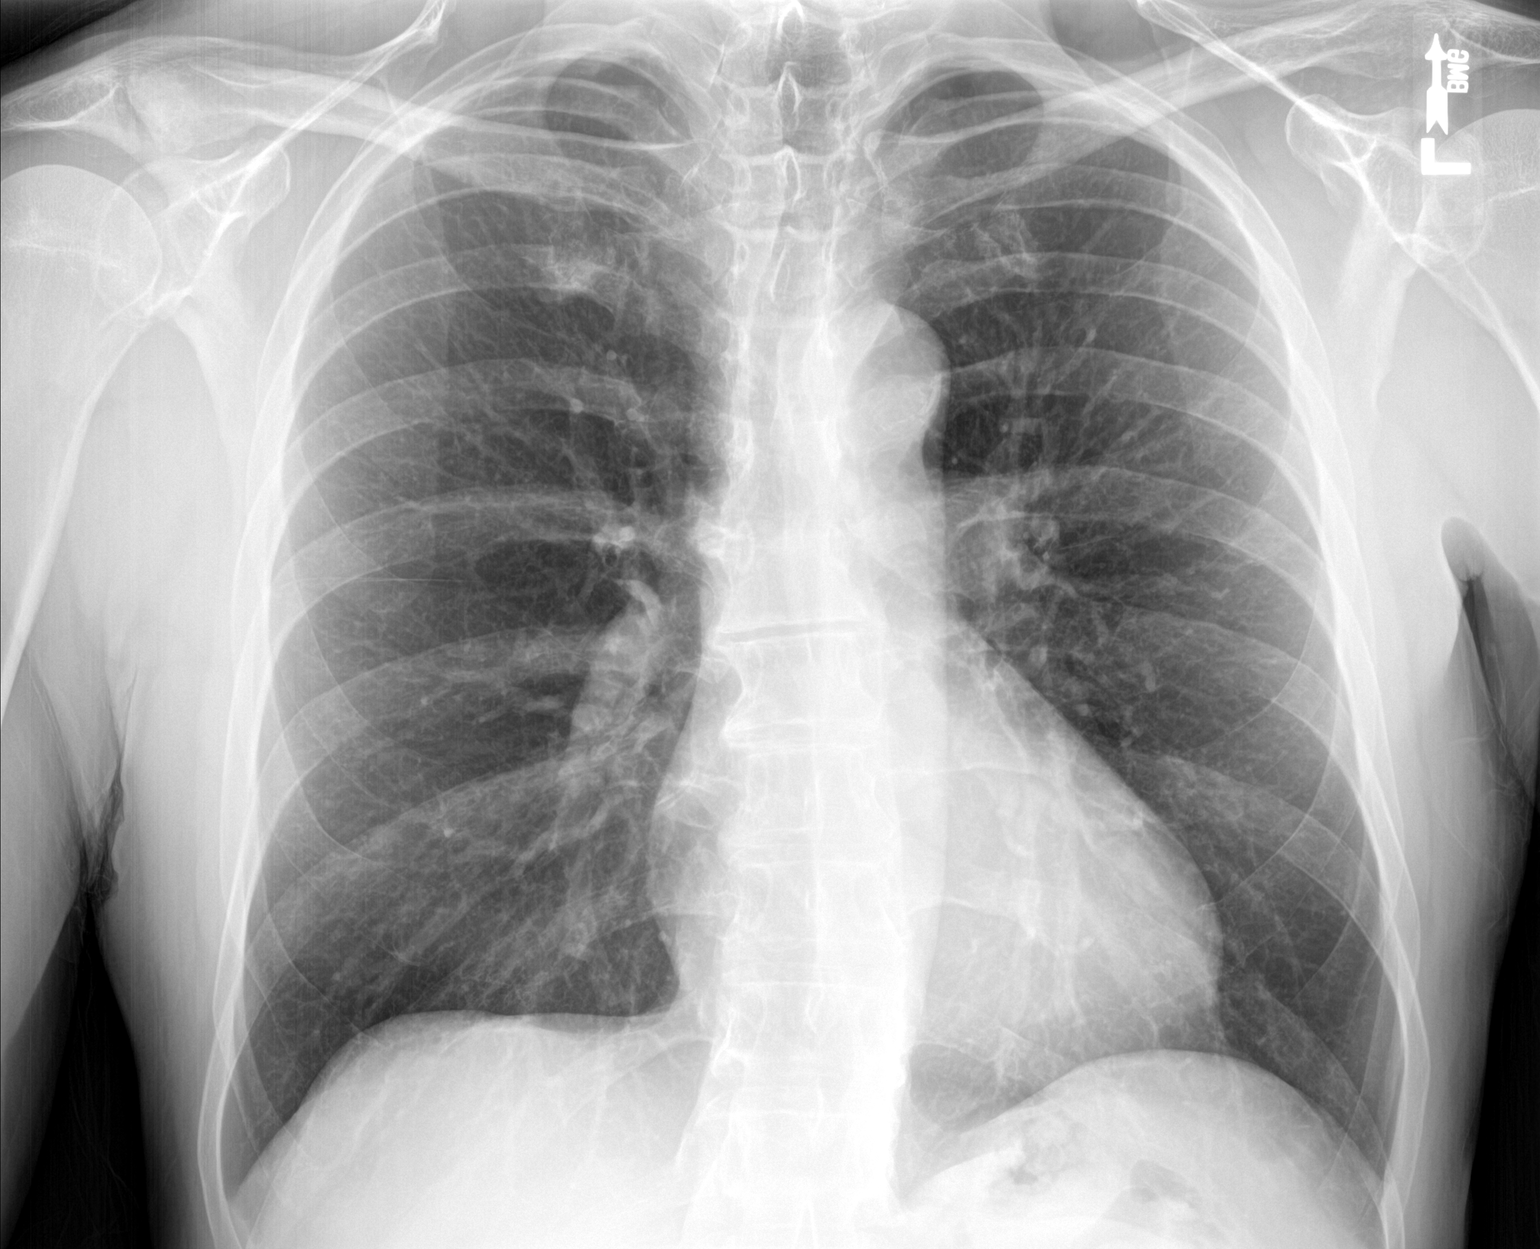

[chest lat]
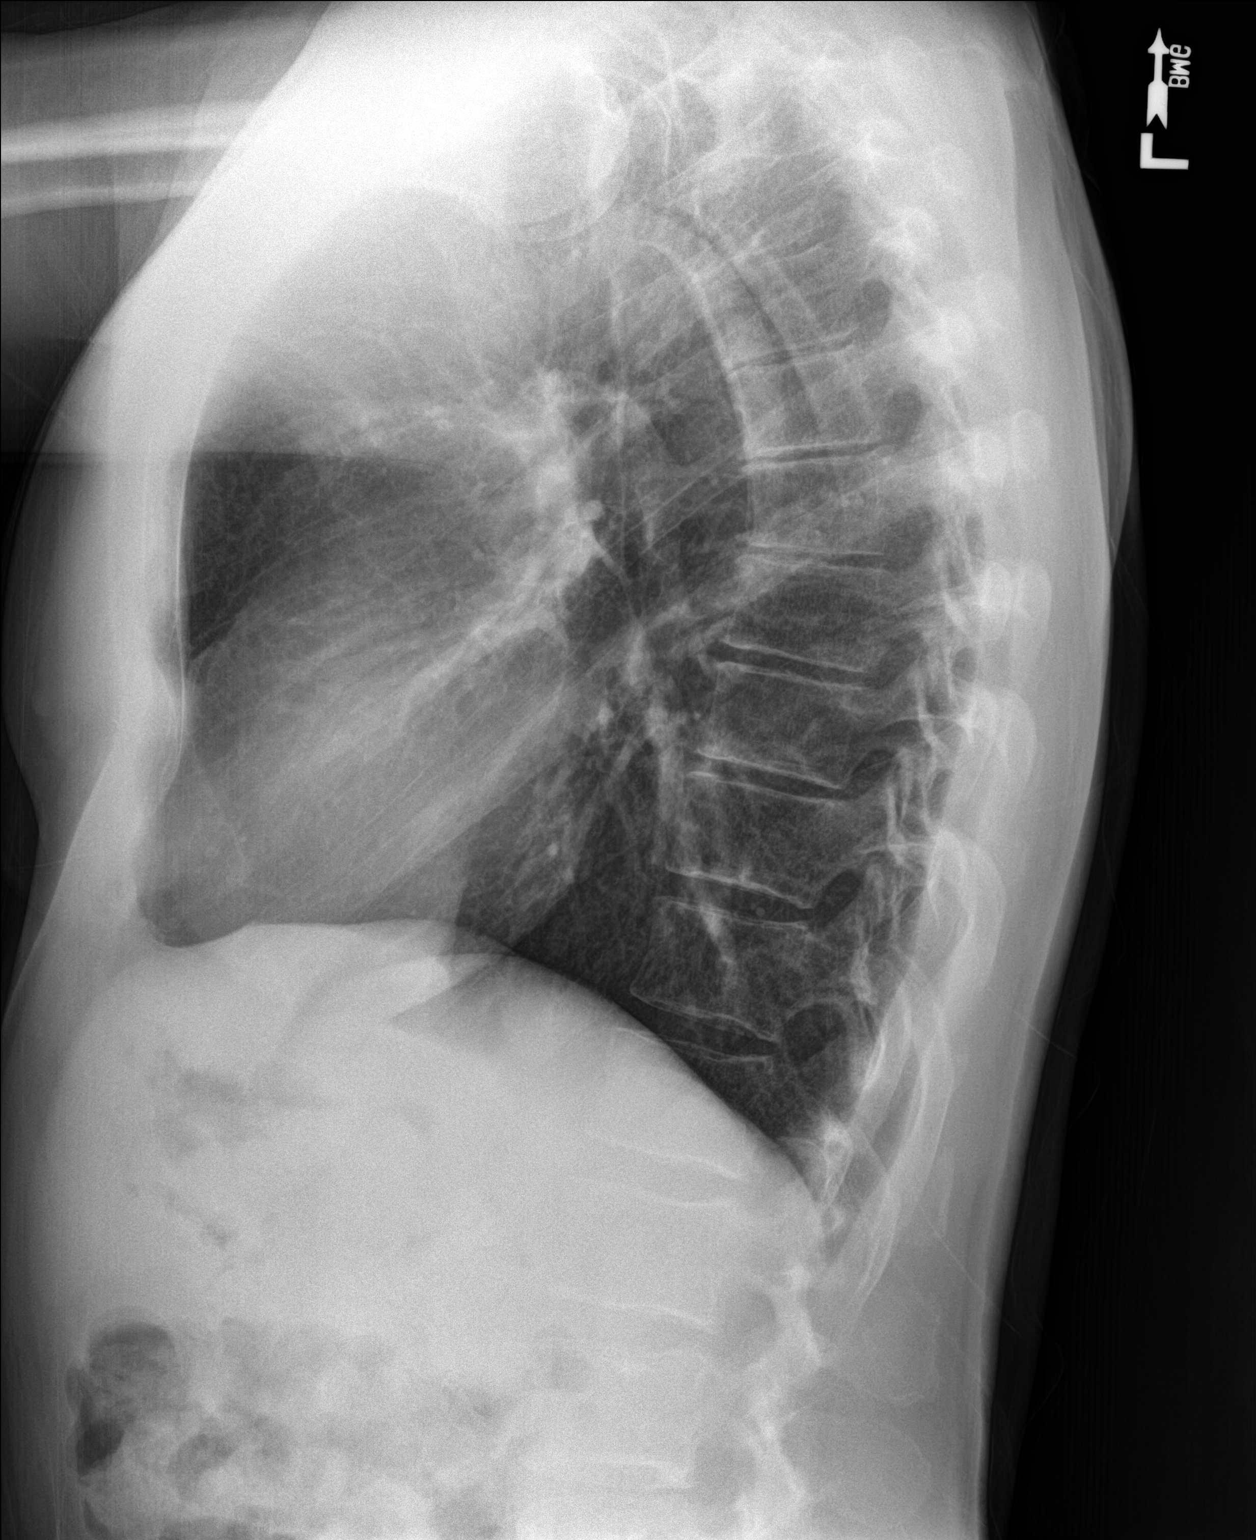

[2 of 2 positions shown; findings below may reference images not displayed]

FINDINGS: The lungs are clear and negative for focal airspace consolidation,
pulmonary edema or suspicious pulmonary nodule. No pleural effusion
or pneumothorax. Cardiac and mediastinal contours are within normal
limits. Atherosclerotic calcifications are present in the transverse
aorta. No acute fracture or lytic or blastic osseous lesions. The
visualized upper abdominal bowel gas pattern is unremarkable.
IMPRESSION: No active cardiopulmonary disease.

## 2020-04-16 ENCOUNTER — Telehealth: Payer: Self-pay | Admitting: *Deleted

## 2020-04-16 NOTE — Telephone Encounter (Signed)
Called and left a message for the patient to pick up voltaren gel at the local pharmacy and that the insurance does not cover it. Misty Stanley

## 2020-06-17 ENCOUNTER — Ambulatory Visit: Payer: BC Managed Care – PPO | Admitting: Emergency Medicine

## 2021-01-28 ENCOUNTER — Ambulatory Visit (INDEPENDENT_AMBULATORY_CARE_PROVIDER_SITE_OTHER): Payer: BC Managed Care – PPO

## 2021-01-28 ENCOUNTER — Encounter: Payer: Self-pay | Admitting: Emergency Medicine

## 2021-01-28 ENCOUNTER — Other Ambulatory Visit: Payer: Self-pay

## 2021-01-28 ENCOUNTER — Ambulatory Visit (INDEPENDENT_AMBULATORY_CARE_PROVIDER_SITE_OTHER): Payer: BC Managed Care – PPO | Admitting: Emergency Medicine

## 2021-01-28 VITALS — BP 128/78 | HR 79 | Temp 98.1°F | Ht 73.5 in | Wt 177.2 lb

## 2021-01-28 DIAGNOSIS — Z13220 Encounter for screening for lipoid disorders: Secondary | ICD-10-CM | POA: Diagnosis not present

## 2021-01-28 DIAGNOSIS — Z130 Encounter for screening for diseases of the blood and blood-forming organs and certain disorders involving the immune mechanism: Secondary | ICD-10-CM | POA: Diagnosis not present

## 2021-01-28 DIAGNOSIS — M549 Dorsalgia, unspecified: Secondary | ICD-10-CM

## 2021-01-28 DIAGNOSIS — Z0000 Encounter for general adult medical examination without abnormal findings: Secondary | ICD-10-CM | POA: Diagnosis not present

## 2021-01-28 DIAGNOSIS — Z1329 Encounter for screening for other suspected endocrine disorder: Secondary | ICD-10-CM | POA: Diagnosis not present

## 2021-01-28 DIAGNOSIS — I700 Atherosclerosis of aorta: Secondary | ICD-10-CM

## 2021-01-28 DIAGNOSIS — Z13228 Encounter for screening for other metabolic disorders: Secondary | ICD-10-CM

## 2021-01-28 DIAGNOSIS — N5312 Painful ejaculation: Secondary | ICD-10-CM

## 2021-01-28 LAB — COMPREHENSIVE METABOLIC PANEL
ALT: 13 U/L (ref 0–53)
AST: 19 U/L (ref 0–37)
Albumin: 4.9 g/dL (ref 3.5–5.2)
Alkaline Phosphatase: 76 U/L (ref 39–117)
BUN: 18 mg/dL (ref 6–23)
CO2: 31 mEq/L (ref 19–32)
Calcium: 9.9 mg/dL (ref 8.4–10.5)
Chloride: 101 mEq/L (ref 96–112)
Creatinine, Ser: 0.78 mg/dL (ref 0.40–1.50)
GFR: 93.42 mL/min (ref >60.00)
Glucose, Bld: 86 mg/dL (ref 70–99)
Potassium: 4.4 mEq/L (ref 3.5–5.1)
Sodium: 138 mEq/L (ref 135–145)
Total Bilirubin: 1.8 mg/dL — ABNORMAL HIGH (ref 0.2–1.2)
Total Protein: 7.2 g/dL (ref 6.0–8.3)

## 2021-01-28 LAB — LIPID PANEL
Cholesterol: 183 mg/dL (ref 0–200)
HDL: 58.3 mg/dL (ref >39.00)
LDL Cholesterol: 110 mg/dL — ABNORMAL HIGH (ref 0–99)
NonHDL: 124.63
Total CHOL/HDL Ratio: 3
Triglycerides: 72 mg/dL (ref 0.0–149.0)
VLDL: 14.4 mg/dL (ref 0.0–40.0)

## 2021-01-28 LAB — CBC WITH DIFFERENTIAL/PLATELET
Basophils Absolute: 0 10*3/uL (ref 0.0–0.1)
Basophils Relative: 0.4 % (ref 0.0–3.0)
Eosinophils Absolute: 0 10*3/uL (ref 0.0–0.7)
Eosinophils Relative: 0.7 % (ref 0.0–5.0)
HCT: 47 % (ref 39.0–52.0)
Hemoglobin: 16.2 g/dL (ref 13.0–17.0)
Lymphocytes Relative: 23.6 % (ref 12.0–46.0)
Lymphs Abs: 1.3 10*3/uL (ref 0.7–4.0)
MCHC: 34.5 g/dL (ref 30.0–36.0)
MCV: 92.4 fl (ref 78.0–100.0)
Monocytes Absolute: 0.4 10*3/uL (ref 0.1–1.0)
Monocytes Relative: 7.2 % (ref 3.0–12.0)
Neutro Abs: 3.8 10*3/uL (ref 1.4–7.7)
Neutrophils Relative %: 68.1 % (ref 43.0–77.0)
Platelets: 261 10*3/uL (ref 150.0–400.0)
RBC: 5.09 Mil/uL (ref 4.22–5.81)
RDW: 12.6 % (ref 11.5–15.5)
WBC: 5.6 10*3/uL (ref 4.0–10.5)

## 2021-01-28 LAB — PSA: PSA: 2.44 ng/mL (ref 0.10–4.00)

## 2021-01-28 LAB — HEMOGLOBIN A1C: Hgb A1c MFr Bld: 5.2 % (ref 4.6–6.5)

## 2021-01-28 NOTE — Progress Notes (Signed)
Taylor BasemanJames Morton 65 y.o.   Chief Complaint  Patient presents with  . Annual Exam    HISTORY OF PRESENT ILLNESS: This is a 65 y.o. male here for annual exam. Occasionally smokes marijuana. Otherwise healthy male with a healthy lifestyle. Has occasional mid back pain.  Requesting chest x-ray. Also has lower back pain when ejaculating.  Needs urology referral.  HPI   Prior to Admission medications   Medication Sig Start Date End Date Taking? Authorizing Provider  IBUPROFEN PO Take by mouth as needed.   Yes [provider]  tadalafil (CIALIS) 20 MG tablet Take 0.5-1 tablets (10-20 mg total) by mouth every other day as needed for erectile dysfunction. 12/18/19  Yes Garion Wempe, Eilleen KempfMiguel Jose, MD  diclofenac Sodium (VOLTAREN) 1 % GEL Apply 2 g topically 4 (four) times daily. Rub into affected area of foot 2 to 4 times daily Patient not taking: Reported on 01/28/2021 03/20/20   Vivi BarrackWagoner, Matthew R, DPM    Allergies  Allergen Reactions  . Dust Mite Extract     cough    Patient Active Problem List   Diagnosis Date Noted  . Coronary artery calcification 02/28/2017  . Asthma     Past Medical History:  Diagnosis Date  . Asthma    caused by dust - no inhaler  . Bradycardia   . Chest pain    no current problems  . COPD (chronic obstructive pulmonary disease) (HCC)     Past Surgical History:  Procedure Laterality Date  . APPENDECTOMY      Social History   Socioeconomic History  . Marital status: Married    Spouse name: Annabelle HarmanDana  . Number of children: 2  . Years of education: 3616  . Highest education level: Not on file  Occupational History  . Occupation: Advertising account executivechool Teacher    Employer: [employer]    Comment: [employer]  Tobacco Use  . Smoking status: Former Smoker    Packs/day: 1.00    Years: 10.00    Pack years: 10.00    Types: Cigarettes    Quit date: 1992    Years since quitting: 30.3  . Smokeless tobacco: Never Used  Vaping Use   . Vaping Use: Never used  Substance and Sexual Activity  . Alcohol use: Yes    Alcohol/week: 7.0 - 14.0 standard drinks    Types: 7 - 14 Standard drinks or equivalent per week    Comment: 1-2 day beer / liquor  . Drug use: No  . Sexual activity: Yes  Other Topics Concern  . Not on file  Social History Narrative   Lives with his wife.  Sons are both in college.   Social Determinants of Health   Financial Resource Strain: Not on file  Food Insecurity: Not on file  Transportation Needs: Not on file  Physical Activity: Not on file  Stress: Not on file  Social Connections: Not on file  Intimate Partner Violence: Not on file    Family History  Problem Relation Age of Onset  . Cancer Father   . Arthritis Mother   . Colon cancer Neg Hx   . Rectal cancer Neg Hx   . Stomach cancer Neg Hx      Review of Systems  Constitutional: Negative.  Negative for chills and fever.  HENT: Negative.  Negative for congestion and sore throat.   Respiratory: Negative.  Negative for cough and shortness of breath.   Cardiovascular: Negative.  Negative for chest  pain and palpitations.  Gastrointestinal: Negative.  Negative for abdominal pain, diarrhea, nausea and vomiting.  Genitourinary: Negative.  Negative for dysuria, flank pain, frequency and hematuria.       Pain in low back when ejaculating  Skin: Negative.  Negative for rash.  Neurological: Negative.  Negative for dizziness and headaches.  All other systems reviewed and are negative.  Today's Vitals   01/28/21 0943  BP: 128/78  Pulse: 79  Temp: 98.1 F (36.7 C)  TempSrc: Oral  SpO2: 98%  Weight: 177 lb 3.2 oz (80.4 kg)  Height: 6' 1.5" (1.867 m)   Body mass index is 23.06 kg/m.   Physical Exam Vitals reviewed.  Constitutional:      Appearance: Normal appearance.  HENT:     Head: Normocephalic.     Right Ear: Tympanic membrane, ear canal and external ear normal.     Left Ear: Tympanic membrane, ear canal and external ear  normal.     Mouth/Throat:     Mouth: Mucous membranes are moist.     Pharynx: Oropharynx is clear.  Eyes:     Extraocular Movements: Extraocular movements intact.     Conjunctiva/sclera: Conjunctivae normal.     Pupils: Pupils are equal, round, and reactive to light.  Cardiovascular:     Rate and Rhythm: Normal rate and regular rhythm.     Pulses: Normal pulses.     Heart sounds: Normal heart sounds.  Pulmonary:     Effort: Pulmonary effort is normal.     Breath sounds: Normal breath sounds.  Abdominal:     General: Bowel sounds are normal. There is no distension.     Palpations: Abdomen is soft. There is no mass.     Tenderness: There is no abdominal tenderness.  Musculoskeletal:        General: Normal range of motion.     Cervical back: Normal range of motion and neck supple. No tenderness.  Lymphadenopathy:     Cervical: No cervical adenopathy.  Skin:    General: Skin is warm and dry.     Capillary Refill: Capillary refill takes less than 2 seconds.  Neurological:     General: No focal deficit present.     Mental Status: He is alert and oriented to person, place, and time.  Psychiatric:        Mood and Affect: Mood normal.        Behavior: Behavior normal.     DG Chest 2 View  Result Date: 01/28/2021 CLINICAL DATA:  Recent posterior mid back pain and occasional chest pain, history asthma, bradycardia, COPD, former smoker EXAM: CHEST - 2 VIEW COMPARISON:  01/16/2020 FINDINGS: Normal heart size, mediastinal contours, and pulmonary vascularity. Lungs hyperinflated but clear. No pulmonary infiltrate, pleural effusion, or pneumothorax. Mild atherosclerotic calcification thoracic aorta. No acute osseous findings. IMPRESSION: Hyperinflated lungs without acute infiltrate. Aortic Atherosclerosis (ICD10-I70.0). Electronically Signed   By: Ulyses Southward M.D.   On: 01/28/2021 10:39     ASSESSMENT & PLAN: [name] was seen today for annual exam.  Diagnoses and all orders for this  visit:  Routine general medical examination at a health care facility  Screening for deficiency anemia -     CBC with Differential/Platelet  Screening for lipoid disorders -     Lipid panel  Screening for endocrine, metabolic and immunity disorder -     Comprehensive metabolic panel -     Hemoglobin A1c -     PSA  Mid back pain -  DG Chest 2 View  Painful ejaculation -     Ambulatory referral to Urology  Aortic atherosclerosis (HCC)   Modifiable risk factors discussed with patient. Anticipatory guidance provided. The following topics were discussed: Smoking Diet and nutrition Benefits of exercise Cancer screening and review of recent colonoscopy which showed diverticulosis but no cancer findings. Vaccinations Cardiovascular risk assessment Mental health including depression and anxiety Fall and accident prevention GU symptoms and recommendation for urology evaluation   Patient Instructions     Health Maintenance, Male Adopting a healthy lifestyle and getting preventive care are important in promoting health and wellness. Ask your health care provider about:  The right schedule for you to have regular tests and exams.  Things you can do on your own to prevent diseases and keep yourself healthy. What should I know about diet, weight, and exercise? Eat a healthy diet  Eat a diet that includes plenty of vegetables, fruits, low-fat dairy products, and lean protein.  Do not eat a lot of foods that are high in solid fats, added sugars, or sodium.   Maintain a healthy weight Body mass index (BMI) is a measurement that can be used to identify possible weight problems. It estimates body fat based on height and weight. Your health care provider can help determine your BMI and help you achieve or maintain a healthy weight. Get regular exercise Get regular exercise. This is one of the most important things you can do for your health. Most adults should:  Exercise for  at least 150 minutes each week. The exercise should increase your heart rate and make you sweat (moderate-intensity exercise).  Do strengthening exercises at least twice a week. This is in addition to the moderate-intensity exercise.  Spend less time sitting. Even light physical activity can be beneficial. Watch cholesterol and blood lipids Have your blood tested for lipids and cholesterol at 66 years of age, then have this test every 5 years. You may need to have your cholesterol levels checked more often if:  Your lipid or cholesterol levels are high.  You are older than 66 years of age.  You are at high risk for heart disease. What should I know about cancer screening? Many types of cancers can be detected early and may often be prevented. Depending on your health history and family history, you may need to have cancer screening at various ages. This may include screening for:  Colorectal cancer.  Prostate cancer.  Skin cancer.  Lung cancer. What should I know about heart disease, diabetes, and high blood pressure? Blood pressure and heart disease  High blood pressure causes heart disease and increases the risk of stroke. This is more likely to develop in people who have high blood pressure readings, are of African descent, or are overweight.  Talk with your health care provider about your target blood pressure readings.  Have your blood pressure checked: ? Every 3-5 years if you are 27-73 years of age. ? Every year if you are 53 years old or older.  If you are between the ages of 52 and 87 and are a current or former smoker, ask your health care provider if you should have a one-time screening for abdominal aortic aneurysm (AAA). Diabetes Have regular diabetes screenings. This checks your fasting blood sugar level. Have the screening done:  Once every three years after age 35 if you are at a normal weight and have a low risk for diabetes.  More often and at a younger age  if you are overweight or have a high risk for diabetes. What should I know about preventing infection? Hepatitis B If you have a higher risk for hepatitis B, you should be screened for this virus. Talk with your health care provider to find out if you are at risk for hepatitis B infection. Hepatitis C Blood testing is recommended for:  Everyone born from [birth year] through 1966.  Anyone with known risk factors for hepatitis C. Sexually transmitted infections (STIs)  You should be screened each year for STIs, including gonorrhea and chlamydia, if: ? You are sexually active and are younger than 66 years of age. ? You are older than 66 years of age and your health care provider tells you that you are at risk for this type of infection. ? Your sexual activity has changed since you were last screened, and you are at increased risk for chlamydia or gonorrhea. Ask your health care provider if you are at risk.  Ask your health care provider about whether you are at high risk for HIV. Your health care provider may recommend a prescription medicine to help prevent HIV infection. If you choose to take medicine to prevent HIV, you should first get tested for HIV. You should then be tested every 3 months for as long as you are taking the medicine. Follow these instructions at home: Lifestyle  Do not use any products that contain nicotine or tobacco, such as cigarettes, e-cigarettes, and chewing tobacco. If you need help quitting, ask your health care provider.  Do not use street drugs.  Do not share needles.  Ask your health care provider for help if you need support or information about quitting drugs. Alcohol use  Do not drink alcohol if your health care provider tells you not to drink.  If you drink alcohol: ? Limit how much you have to 0-2 drinks a day. ? Be aware of how much alcohol is in your drink. In the [place] one drink equals one 12 oz bottle of beer (355 mL), one 5 oz glass of wine (148 mL),  or one 1 oz glass of hard liquor (44 mL). General instructions  Schedule regular health, dental, and eye exams.  Stay current with your vaccines.  Tell your health care provider if: ? You often feel depressed. ? You have ever been abused or do not feel safe at home. Summary  Adopting a healthy lifestyle and getting preventive care are important in promoting health and wellness.  Follow your health care provider's instructions about healthy diet, exercising, and getting tested or screened for diseases.  Follow your health care provider's instructions on monitoring your cholesterol and blood pressure. This information is not intended to replace advice given to you by your health care provider. Make sure you discuss any questions you have with your health care provider. Document Revised: 09/06/2018 Document Reviewed: 09/06/2018 Elsevier Patient Education  2021 Elsevier Inc.     Edwina Barth, MD [facility]

## 2021-01-28 NOTE — Patient Instructions (Signed)

## 2021-01-29 ENCOUNTER — Other Ambulatory Visit: Payer: Self-pay | Admitting: Emergency Medicine

## 2021-01-29 DIAGNOSIS — I700 Atherosclerosis of aorta: Secondary | ICD-10-CM

## 2021-01-29 MED ORDER — ROSUVASTATIN CALCIUM 10 MG PO TABS: 10.0000 mg | ORAL_TABLET | Freq: Every day | ORAL | 3 refills | Status: DC

## 2021-09-03 ENCOUNTER — Other Ambulatory Visit: Payer: Self-pay | Admitting: Emergency Medicine

## 2021-09-03 DIAGNOSIS — N529 Male erectile dysfunction, unspecified: Secondary | ICD-10-CM

## 2021-11-03 ENCOUNTER — Telehealth: Payer: BC Managed Care – PPO | Admitting: Internal Medicine

## 2021-11-03 ENCOUNTER — Telehealth: Payer: Self-pay | Admitting: Emergency Medicine

## 2021-11-03 NOTE — Telephone Encounter (Signed)
Called and spoke with pt, he has OV at [place] to discuss symptoms.

## 2021-11-03 NOTE — Telephone Encounter (Signed)
Connected to [facility] 2.7.2023.  pt sick with UTI and constipated. Has been on abx since Sunday. Has been having frequency of urination. Last BM on Saturday, feels bloated.  Advised to see HCP within 4 hours. Pt had MyChart visit

## 2021-11-18 ENCOUNTER — Ambulatory Visit: Payer: Self-pay | Admitting: Emergency Medicine

## 2022-02-25 ENCOUNTER — Encounter (HOSPITAL_COMMUNITY): Payer: Self-pay

## 2022-02-25 ENCOUNTER — Other Ambulatory Visit: Payer: Self-pay

## 2022-02-25 ENCOUNTER — Emergency Department (HOSPITAL_COMMUNITY)
Admission: EM | Admit: 2022-02-25 | Discharge: 2022-02-25 | Discharge status: Against Medical Advice | Payer: Medicare PPO | Attending: Emergency Medicine | Admitting: Emergency Medicine

## 2022-02-25 DIAGNOSIS — Z5321 Procedure and treatment not carried out due to patient leaving prior to being seen by health care provider: Secondary | ICD-10-CM | POA: Insufficient documentation

## 2022-02-25 DIAGNOSIS — N401 Enlarged prostate with lower urinary tract symptoms: Secondary | ICD-10-CM | POA: Insufficient documentation

## 2022-02-25 DIAGNOSIS — R339 Retention of urine, unspecified: Secondary | ICD-10-CM | POA: Diagnosis present

## 2022-02-25 NOTE — ED Triage Notes (Signed)
Pt had his urinary catheter removed Wednesday around 11 am and hasn't been able to urinate since 11 pm last night. Pt states that he has BPH.

## 2022-04-28 ENCOUNTER — Ambulatory Visit (INDEPENDENT_AMBULATORY_CARE_PROVIDER_SITE_OTHER): Payer: Medicare PPO | Admitting: Emergency Medicine

## 2022-04-28 ENCOUNTER — Ambulatory Visit (INDEPENDENT_AMBULATORY_CARE_PROVIDER_SITE_OTHER): Payer: Medicare PPO

## 2022-04-28 ENCOUNTER — Encounter: Payer: Self-pay | Admitting: Emergency Medicine

## 2022-04-28 VITALS — BP 146/80 | HR 59 | Temp 98.2°F | Ht 73.25 in | Wt 189.0 lb

## 2022-04-28 DIAGNOSIS — Z87438 Personal history of other diseases of male genital organs: Secondary | ICD-10-CM | POA: Diagnosis not present

## 2022-04-28 DIAGNOSIS — Z1329 Encounter for screening for other suspected endocrine disorder: Secondary | ICD-10-CM

## 2022-04-28 DIAGNOSIS — J449 Chronic obstructive pulmonary disease, unspecified: Secondary | ICD-10-CM

## 2022-04-28 DIAGNOSIS — Z13228 Encounter for screening for other metabolic disorders: Secondary | ICD-10-CM | POA: Diagnosis not present

## 2022-04-28 DIAGNOSIS — Z87891 Personal history of nicotine dependence: Secondary | ICD-10-CM

## 2022-04-28 DIAGNOSIS — Z13220 Encounter for screening for lipoid disorders: Secondary | ICD-10-CM

## 2022-04-28 DIAGNOSIS — Z130 Encounter for screening for diseases of the blood and blood-forming organs and certain disorders involving the immune mechanism: Secondary | ICD-10-CM

## 2022-04-28 DIAGNOSIS — Z0000 Encounter for general adult medical examination without abnormal findings: Secondary | ICD-10-CM | POA: Diagnosis not present

## 2022-04-28 LAB — CBC WITH DIFFERENTIAL/PLATELET
Basophils Absolute: 0 10*3/uL (ref 0.0–0.1)
Basophils Relative: 0.4 % (ref 0.0–3.0)
Eosinophils Absolute: 0.1 10*3/uL (ref 0.0–0.7)
Eosinophils Relative: 2.3 % (ref 0.0–5.0)
HCT: 43.6 % (ref 39.0–52.0)
Hemoglobin: 14.5 g/dL (ref 13.0–17.0)
Lymphocytes Relative: 21.4 % (ref 12.0–46.0)
Lymphs Abs: 1.2 10*3/uL (ref 0.7–4.0)
MCHC: 33.3 g/dL (ref 30.0–36.0)
MCV: 92.9 fl (ref 78.0–100.0)
Monocytes Absolute: 0.4 10*3/uL (ref 0.1–1.0)
Monocytes Relative: 7.4 % (ref 3.0–12.0)
Neutro Abs: 3.7 10*3/uL (ref 1.4–7.7)
Neutrophils Relative %: 68.5 % (ref 43.0–77.0)
Platelets: 210 10*3/uL (ref 150.0–400.0)
RBC: 4.7 Mil/uL (ref 4.22–5.81)
RDW: 12.4 % (ref 11.5–15.5)
WBC: 5.4 10*3/uL (ref 4.0–10.5)

## 2022-04-28 LAB — HEMOGLOBIN A1C: Hgb A1c MFr Bld: 5.6 % (ref 4.6–6.5)

## 2022-04-28 LAB — LIPID PANEL
Cholesterol: 206 mg/dL — ABNORMAL HIGH (ref 0–200)
HDL: 57.7 mg/dL (ref >39.00)
LDL Cholesterol: 126 mg/dL — ABNORMAL HIGH (ref 0–99)
NonHDL: 147.95
Total CHOL/HDL Ratio: 4
Triglycerides: 110 mg/dL (ref 0.0–149.0)
VLDL: 22 mg/dL (ref 0.0–40.0)

## 2022-04-28 LAB — COMPREHENSIVE METABOLIC PANEL
ALT: 38 U/L (ref 0–53)
AST: 65 U/L — ABNORMAL HIGH (ref 0–37)
Albumin: 4.8 g/dL (ref 3.5–5.2)
Alkaline Phosphatase: 79 U/L (ref 39–117)
BUN: 16 mg/dL (ref 6–23)
CO2: 28 mEq/L (ref 19–32)
Calcium: 9.7 mg/dL (ref 8.4–10.5)
Chloride: 99 mEq/L (ref 96–112)
Creatinine, Ser: 0.88 mg/dL (ref 0.40–1.50)
GFR: 89.29 mL/min (ref >60.00)
Glucose, Bld: 78 mg/dL (ref 70–99)
Potassium: 4.2 mEq/L (ref 3.5–5.1)
Sodium: 134 mEq/L — ABNORMAL LOW (ref 135–145)
Total Bilirubin: 1.2 mg/dL (ref 0.2–1.2)
Total Protein: 7.5 g/dL (ref 6.0–8.3)

## 2022-04-28 LAB — PSA: PSA: 1.6 ng/mL (ref 0.10–4.00)

## 2022-04-28 NOTE — Progress Notes (Signed)
Taylor Morton 66 y.o.   Chief Complaint  Patient presents with   Annual Exam    HISTORY OF PRESENT ILLNESS: This is a 67 y.o. male here for annual exam Recent history of BPH and severe urinary retention.  Presently on Flomax and finasteride with good results.  Sees urologist on a regular basis. No other complaints or medical concerns today. Former smoker.  Requesting chest x-ray.  HPI   Prior to Admission medications   Medication Sig Start Date End Date Taking? Authorizing Provider  bethanechol (URECHOLINE) 25 MG tablet Take 25 mg by mouth 4 (four) times daily. 02/18/22  Yes [provider]  finasteride (PROSCAR) 5 MG tablet Take 5 mg by mouth daily. 12/30/21  Yes [provider]  tamsulosin (FLOMAX) 0.4 MG CAPS capsule Take 0.8 mg by mouth daily. 02/22/22  Yes [provider]  ciprofloxacin (CIPRO) 500 MG tablet Take 500 mg by mouth 2 (two) times daily. 11/01/21   [provider]  diclofenac Sodium (VOLTAREN) 1 % GEL Apply 2 g topically 4 (four) times daily. Rub into affected area of foot 2 to 4 times daily Patient not taking: Reported on 01/28/2021 03/20/20   Vivi Barrack, DPM  IBUPROFEN PO Take by mouth as needed.    [provider]  rosuvastatin (CRESTOR) 10 MG tablet Take 1 tablet (10 mg total) by mouth daily. 01/29/21   Georgina Quint, MD  sulfamethoxazole-trimethoprim (BACTRIM DS) 800-160 MG tablet Take 1 tablet by mouth 2 (two) times daily. 12/27/21   [provider]  tadalafil (CIALIS) 20 MG tablet TAKE 0.5-1 TAB BY MOUTH EVERY OTHER DAY AS NEEDED FOR ERECTILE DYSFUNCTION. *NOT COVERED** Patient taking differently: Take 10 mg by mouth daily as needed for erectile dysfunction. 09/03/21   Georgina Quint, MD    Allergies  Allergen Reactions   Dust Mite Extract     cough    Patient Active Problem List   Diagnosis Date Noted   Coronary artery calcification 02/28/2017   Asthma     Past Medical History:   Diagnosis Date   Asthma    caused by dust - no inhaler   Bradycardia    Chest pain    no current problems   COPD (chronic obstructive pulmonary disease) (HCC)     Past Surgical History:  Procedure Laterality Date   APPENDECTOMY      Social History   Socioeconomic History   Marital status: Married    Spouse name: Annabelle Harman   Number of children: 2   Years of education: 16   Highest education level: Not on file  Occupational History   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[employer]    Comment: Earth Engineer, water Issues  Tobacco Use   Smoking status: Former    Packs/day: 1.00    Years: 10.00    Total pack years: 10.00    Types: Cigarettes    Quit date: 1992    Years since quitting: 31.6   Smokeless tobacco: Never  Vaping Use   Vaping Use: Never used  Substance and Sexual Activity   Alcohol use: Yes    Alcohol/week: 7.0 - 14.0 standard drinks of alcohol    Types: 7 - 14 Standard drinks or equivalent per week    Comment: 1-2 day beer / liquor   Drug use: No   Sexual activity: Yes  Other Topics Concern   Not on file  Social History Narrative   Lives with his wife.  Sons are both in college.   Social Determinants of Health   Financial Resource Strain: Not on file  Food Insecurity: Not on file  Transportation Needs: Not on file  Physical Activity: Not on file  Stress: Not on file  Social Connections: Not on file  Intimate Partner Violence: Not on file    Family History  Problem Relation Age of Onset   Cancer Father    Arthritis Mother    Colon cancer Neg Hx    Rectal cancer Neg Hx    Stomach cancer Neg Hx      Review of Systems  Constitutional: Negative.  Negative for chills and fever.  HENT: Negative.  Negative for congestion and sore throat.   Respiratory: Negative.  Negative for cough and shortness of breath.   Cardiovascular: Negative.  Negative for chest pain and palpitations.  Gastrointestinal:  Negative for abdominal  pain, diarrhea, nausea and vomiting.  Genitourinary: Negative.  Negative for dysuria.  Skin: Negative.  Negative for rash.  Neurological:  Negative for dizziness and headaches.  All other systems reviewed and are negative.  Today's Vitals   04/28/22 1124 04/28/22 1129  BP: (!) 156/70 (!) 146/80  Pulse: (!) 59   Temp: 98.2 F (36.8 C)   TempSrc: Oral   SpO2: 97%   Weight: 189 lb (85.7 kg)   Height: 6' 1.25" (1.861 m)    Body mass index is 24.77 kg/m.   Physical Exam Vitals reviewed.  Constitutional:      Appearance: Normal appearance.  HENT:     Head: Normocephalic.     Right Ear: Tympanic membrane, ear canal and external ear normal.     Left Ear: Tympanic membrane, ear canal and external ear normal.     Mouth/Throat:     Mouth: Mucous membranes are moist.     Pharynx: Oropharynx is clear.  Eyes:     Extraocular Movements: Extraocular movements intact.     Conjunctiva/sclera: Conjunctivae normal.     Pupils: Pupils are equal, round, and reactive to light.  Cardiovascular:     Rate and Rhythm: Normal rate and regular rhythm.     Pulses: Normal pulses.     Heart sounds: Normal heart sounds.  Pulmonary:     Effort: Pulmonary effort is normal.     Breath sounds: Normal breath sounds.  Abdominal:     General: Bowel sounds are normal. There is no distension.     Palpations: Abdomen is soft.     Tenderness: There is no abdominal tenderness.  Musculoskeletal:     Cervical back: No tenderness.     Right lower leg: No edema.     Left lower leg: No edema.  Lymphadenopathy:     Cervical: No cervical adenopathy.  Skin:    General: Skin is warm and dry.     Capillary Refill: Capillary refill takes less than 2 seconds.  Neurological:     General: No focal deficit present.     Mental Status: He is alert and oriented to person, place, and time.  Psychiatric:        Mood and Affect: Mood normal.        Behavior: Behavior normal.      ASSESSMENT & PLAN: Problem List  Items Addressed This Visit       Other   History of BPH   Relevant Orders   PSA   Other Visit Diagnoses     Routine general medical examination at a health care facility    -  Primary   Former smoker       Relevant Orders   DG Chest 2 View (Completed)   Screening for deficiency anemia       Relevant Orders   CBC with Differential   Screening for lipoid disorders       Relevant Orders   Lipid panel   Screening for endocrine, metabolic and immunity disorder       Relevant Orders   Comprehensive metabolic panel   Hemoglobin A1c      Modifiable risk factors discussed with patient. Anticipatory guidance according to age provided. The following topics were also discussed: Social Determinants of Health Smoking.  Former smoker Diet and nutrition.  Good eating habits Benefits of exercise.  Exercises regularly Cancer screening and review of colonoscopy report from 2021 Vaccinations recommendations Cardiovascular risk assessment The 10-year ASCVD risk score (Arnett DK, et al., 2019) is: 16%   Values used to calculate the score:     Age: 35 years     Sex: Male     Is Non-Hispanic African American: No     Diabetic: No     Tobacco smoker: No     Systolic Blood Pressure: 123456 mmHg     Is BP treated: No     HDL Cholesterol: 58.3 mg/dL     Total Cholesterol: 183 mg/dL  Mental health including depression and anxiety Fall and accident prevention  Patient Instructions  Health Maintenance, Male Adopting a healthy lifestyle and getting preventive care are important in promoting health and wellness. Ask your health care provider about: The right schedule for you to have regular tests and exams. Things you can do on your own to prevent diseases and keep yourself healthy. What should I know about diet, weight, and exercise? Eat a healthy diet  Eat a diet that includes plenty of vegetables, fruits, low-fat dairy products, and lean protein. Do not eat a lot of foods that are high in  solid fats, added sugars, or sodium. Maintain a healthy weight Body mass index (BMI) is a measurement that can be used to identify possible weight problems. It estimates body fat based on height and weight. Your health care provider can help determine your BMI and help you achieve or maintain a healthy weight. Get regular exercise Get regular exercise. This is one of the most important things you can do for your health. Most adults should: Exercise for at least 150 minutes each week. The exercise should increase your heart rate and make you sweat (moderate-intensity exercise). Do strengthening exercises at least twice a week. This is in addition to the moderate-intensity exercise. Spend less time sitting. Even light physical activity can be beneficial. Watch cholesterol and blood lipids Have your blood tested for lipids and cholesterol at 67 years of age, then have this test every 5 years. You may need to have your cholesterol levels checked more often if: Your lipid or cholesterol levels are high. You are older than 67 years of age. You are at high risk for heart disease. What should I know about cancer screening? Many types of cancers can be detected early and may often be prevented. Depending on your health history and family history, you may need to have cancer screening at various ages. This may include screening for: Colorectal cancer. Prostate cancer. Skin cancer. Lung cancer. What should I know about heart disease, diabetes, and high blood pressure? Blood pressure and heart disease High blood pressure causes heart disease and increases the risk of stroke. This is  more likely to develop in people who have high blood pressure readings or are overweight. Talk with your health care provider about your target blood pressure readings. Have your blood pressure checked: Every 3-5 years if you are 63-5 years of age. Every year if you are 61 years old or older. If you are between the ages  of 21 and 67 and are a current or former smoker, ask your health care provider if you should have a one-time screening for abdominal aortic aneurysm (AAA). Diabetes Have regular diabetes screenings. This checks your fasting blood sugar level. Have the screening done: Once every three years after age 58 if you are at a normal weight and have a low risk for diabetes. More often and at a younger age if you are overweight or have a high risk for diabetes. What should I know about preventing infection? Hepatitis B If you have a higher risk for hepatitis B, you should be screened for this virus. Talk with your health care provider to find out if you are at risk for hepatitis B infection. Hepatitis C Blood testing is recommended for: Everyone born from [birth year] through 1965. Anyone with known risk factors for hepatitis C. Sexually transmitted infections (STIs) You should be screened each year for STIs, including gonorrhea and chlamydia, if: You are sexually active and are younger than 67 years of age. You are older than 67 years of age and your health care provider tells you that you are at risk for this type of infection. Your sexual activity has changed since you were last screened, and you are at increased risk for chlamydia or gonorrhea. Ask your health care provider if you are at risk. Ask your health care provider about whether you are at high risk for HIV. Your health care provider may recommend a prescription medicine to help prevent HIV infection. If you choose to take medicine to prevent HIV, you should first get tested for HIV. You should then be tested every 3 months for as long as you are taking the medicine. Follow these instructions at home: Alcohol use Do not drink alcohol if your health care provider tells you not to drink. If you drink alcohol: Limit how much you have to 0-2 drinks a day. Know how much alcohol is in your drink. In the [place] one drink equals one 12 oz bottle of beer (355  mL), one 5 oz glass of wine (148 mL), or one 1 oz glass of hard liquor (44 mL). Lifestyle Do not use any products that contain nicotine or tobacco. These products include cigarettes, chewing tobacco, and vaping devices, such as e-cigarettes. If you need help quitting, ask your health care provider. Do not use street drugs. Do not share needles. Ask your health care provider for help if you need support or information about quitting drugs. General instructions Schedule regular health, dental, and eye exams. Stay current with your vaccines. Tell your health care provider if: You often feel depressed. You have ever been abused or do not feel safe at home. Summary Adopting a healthy lifestyle and getting preventive care are important in promoting health and wellness. Follow your health care provider's instructions about healthy diet, exercising, and getting tested or screened for diseases. Follow your health care provider's instructions on monitoring your cholesterol and blood pressure. This information is not intended to replace advice given to you by your health care provider. Make sure you discuss any questions you have with your health care provider. Document Revised: 02/02/2021 Document  Reviewed: 02/02/2021 Elsevier Patient Education  2023 Elsevier Inc.     Edwina Barth, MD [facility]

## 2022-04-28 NOTE — Patient Instructions (Signed)
Health Maintenance, Male Adopting a healthy lifestyle and getting preventive care are important in promoting health and wellness. Ask your health care provider about: The right schedule for you to have regular tests and exams. Things you can do on your own to prevent diseases and keep yourself healthy. What should I know about diet, weight, and exercise? Eat a healthy diet  Eat a diet that includes plenty of vegetables, fruits, low-fat dairy products, and lean protein. Do not eat a lot of foods that are high in solid fats, added sugars, or sodium. Maintain a healthy weight Body mass index (BMI) is a measurement that can be used to identify possible weight problems. It estimates body fat based on height and weight. Your health care provider can help determine your BMI and help you achieve or maintain a healthy weight. Get regular exercise Get regular exercise. This is one of the most important things you can do for your health. Most adults should: Exercise for at least 150 minutes each week. The exercise should increase your heart rate and make you sweat (moderate-intensity exercise). Do strengthening exercises at least twice a week. This is in addition to the moderate-intensity exercise. Spend less time sitting. Even light physical activity can be beneficial. Watch cholesterol and blood lipids Have your blood tested for lipids and cholesterol at 67 years of age, then have this test every 5 years. You may need to have your cholesterol levels checked more often if: Your lipid or cholesterol levels are high. You are older than 67 years of age. You are at high risk for heart disease. What should I know about cancer screening? Many types of cancers can be detected early and may often be prevented. Depending on your health history and family history, you may need to have cancer screening at various ages. This may include screening for: Colorectal cancer. Prostate cancer. Skin cancer. Lung  cancer. What should I know about heart disease, diabetes, and high blood pressure? Blood pressure and heart disease High blood pressure causes heart disease and increases the risk of stroke. This is more likely to develop in people who have high blood pressure readings or are overweight. Talk with your health care provider about your target blood pressure readings. Have your blood pressure checked: Every 3-5 years if you are 18-39 years of age. Every year if you are 40 years old or older. If you are between the ages of 65 and 75 and are a current or former smoker, ask your health care provider if you should have a one-time screening for abdominal aortic aneurysm (AAA). Diabetes Have regular diabetes screenings. This checks your fasting blood sugar level. Have the screening done: Once every three years after age 45 if you are at a normal weight and have a low risk for diabetes. More often and at a younger age if you are overweight or have a high risk for diabetes. What should I know about preventing infection? Hepatitis B If you have a higher risk for hepatitis B, you should be screened for this virus. Talk with your health care provider to find out if you are at risk for hepatitis B infection. Hepatitis C Blood testing is recommended for: Everyone born from 1945 through 1965. Anyone with known risk factors for hepatitis C. Sexually transmitted infections (STIs) You should be screened each year for STIs, including gonorrhea and chlamydia, if: You are sexually active and are younger than 67 years of age. You are older than 67 years of age and your   health care provider tells you that you are at risk for this type of infection. Your sexual activity has changed since you were last screened, and you are at increased risk for chlamydia or gonorrhea. Ask your health care provider if you are at risk. Ask your health care provider about whether you are at high risk for HIV. Your health care provider  may recommend a prescription medicine to help prevent HIV infection. If you choose to take medicine to prevent HIV, you should first get tested for HIV. You should then be tested every 3 months for as long as you are taking the medicine. Follow these instructions at home: Alcohol use Do not drink alcohol if your health care provider tells you not to drink. If you drink alcohol: Limit how much you have to 0-2 drinks a day. Know how much alcohol is in your drink. In the [place] one drink equals one 12 oz bottle of beer (355 mL), one 5 oz glass of wine (148 mL), or one 1 oz glass of hard liquor (44 mL). Lifestyle Do not use any products that contain nicotine or tobacco. These products include cigarettes, chewing tobacco, and vaping devices, such as e-cigarettes. If you need help quitting, ask your health care provider. Do not use street drugs. Do not share needles. Ask your health care provider for help if you need support or information about quitting drugs. General instructions Schedule regular health, dental, and eye exams. Stay current with your vaccines. Tell your health care provider if: You often feel depressed. You have ever been abused or do not feel safe at home. Summary Adopting a healthy lifestyle and getting preventive care are important in promoting health and wellness. Follow your health care provider's instructions about healthy diet, exercising, and getting tested or screened for diseases. Follow your health care provider's instructions on monitoring your cholesterol and blood pressure. This information is not intended to replace advice given to you by your health care provider. Make sure you discuss any questions you have with your health care provider. Document Revised: 02/02/2021 Document Reviewed: 02/02/2021 Elsevier Patient Education  2023 Elsevier Inc.  

## 2022-06-27 DEATH — deceased

## 2022-06-28 ENCOUNTER — Telehealth: Payer: Self-pay | Admitting: Emergency Medicine

## 2022-06-28 DIAGNOSIS — Z130 Encounter for screening for diseases of the blood and blood-forming organs and certain disorders involving the immune mechanism: Secondary | ICD-10-CM

## 2022-06-28 DIAGNOSIS — Z87438 Personal history of other diseases of male genital organs: Secondary | ICD-10-CM

## 2022-06-28 DIAGNOSIS — Z13220 Encounter for screening for lipoid disorders: Secondary | ICD-10-CM

## 2022-06-28 NOTE — Telephone Encounter (Signed)
Patient would like to discuss his blood work from August, 2023

## 2022-06-29 NOTE — Telephone Encounter (Signed)
I sent him a message through [place].  He can come back anytime if he wants to be seen again. Okay to repeat labs.

## 2022-06-29 NOTE — Telephone Encounter (Signed)
Called patient  and left message to inform him that lab orders was placed and for patient to make a 66mnth appt with Dr. Mitchel Honour.

## 2022-09-02 ENCOUNTER — Telehealth: Payer: Self-pay | Admitting: Emergency Medicine

## 2022-09-02 NOTE — Telephone Encounter (Signed)
LVM for pt to rtn my call to schedule AWV with [facility] cal back # [phone] 

## 2022-09-16 ENCOUNTER — Telehealth: Payer: Self-pay | Admitting: Emergency Medicine

## 2022-09-16 DIAGNOSIS — N529 Male erectile dysfunction, unspecified: Secondary | ICD-10-CM

## 2022-09-16 NOTE — Telephone Encounter (Signed)
Caller & Relationship to patient:  self   Call back number:  [phone]   Date of last office visit:  04/28/2022   Date of next office visit:  10/26/2022   Medication(s) to be refilled:  Tadalifil        Preferred Pharmacy:  [facility] on [address] [place]

## 2022-09-17 ENCOUNTER — Ambulatory Visit (INDEPENDENT_AMBULATORY_CARE_PROVIDER_SITE_OTHER): Payer: Medicare PPO

## 2022-09-17 VITALS — Ht 73.25 in | Wt 192.0 lb

## 2022-09-17 DIAGNOSIS — Z0000 Encounter for general adult medical examination without abnormal findings: Secondary | ICD-10-CM | POA: Diagnosis not present

## 2022-09-17 MED ORDER — TADALAFIL 20 MG PO TABS: 10.0000 mg | ORAL_TABLET | Freq: Every day | ORAL | 5 refills | Status: AC | PRN

## 2022-09-17 NOTE — Patient Instructions (Addendum)
Taylor Morton , Thank you for taking time to come for your Medicare Wellness Visit. I appreciate your ongoing commitment to your health goals. Please review the following plan we discussed and let me know if I can assist you in the future.   These are the goals we discussed:  Goals      To maintain my current health status by continuing to eat healthy, stay physically active and socially active.        This is a list of the screening recommended for you and due dates:  Health Maintenance  Topic Date Due   DTaP/Tdap/Td vaccine (1 - Tdap) Never done   Flu Shot  Never done   COVID-19 Vaccine (5 - 2023-24 season) 05/28/2022   Zoster (Shingles) Vaccine (1 of 2) 10/29/2022*   Pneumonia Vaccine (1 - PCV) 04/29/2023*   Medicare Annual Wellness Visit  09/18/2023   Colon Cancer Screening  03/03/2030   Hepatitis C Screening: USPSTF Recommendation to screen - Ages 18-79 yo.  Completed   HPV Vaccine  Aged Out  *Topic was postponed. The date shown is not the original due date.    Advanced directives: No; Advance directive discussed with you today. Even though you declined this today please call our office should you change your mind and we can give you the proper paperwork for you to fill out.  Conditions/risks identified: Yes  Next appointment: Follow up in one year for your annual wellness visit.   Preventive Care 67 Years and Older, Male  Preventive care refers to lifestyle choices and visits with your health care provider that can promote health and wellness. What does preventive care include? A yearly physical exam. This is also called an annual well check. Dental exams once or twice a year. Routine eye exams. Ask your health care provider how often you should have your eyes checked. Personal lifestyle choices, including: Daily care of your teeth and gums. Regular physical activity. Eating a healthy diet. Avoiding tobacco and drug use. Limiting alcohol use. Practicing safe sex. Taking  low doses of aspirin every day. Taking vitamin and mineral supplements as recommended by your health care provider. What happens during an annual well check? The services and screenings done by your health care provider during your annual well check will depend on your age, overall health, lifestyle risk factors, and family history of disease. Counseling  Your health care provider may ask you questions about your: Alcohol use. Tobacco use. Drug use. Emotional well-being. Home and relationship well-being. Sexual activity. Eating habits. History of falls. Memory and ability to understand (cognition). Work and work Astronomer. Screening  You may have the following tests or measurements: Height, weight, and BMI. Blood pressure. Lipid and cholesterol levels. These may be checked every 5 years, or more frequently if you are over 76 years old. Skin check. Lung cancer screening. You may have this screening every year starting at age 62 if you have a 30-pack-year history of smoking and currently smoke or have quit within the past 15 years. Fecal occult blood test (FOBT) of the stool. You may have this test every year starting at age 30. Flexible sigmoidoscopy or colonoscopy. You may have a sigmoidoscopy every 5 years or a colonoscopy every 10 years starting at age 79. Prostate cancer screening. Recommendations will vary depending on your family history and other risks. Hepatitis C blood test. Hepatitis B blood test. Sexually transmitted disease (STD) testing. Diabetes screening. This is done by checking your blood sugar (glucose) after you  have not eaten for a while (fasting). You may have this done every 1-3 years. Abdominal aortic aneurysm (AAA) screening. You may need this if you are a current or former smoker. Osteoporosis. You may be screened starting at age 23 if you are at high risk. Talk with your health care provider about your test results, treatment options, and if necessary, the  need for more tests. Vaccines  Your health care provider may recommend certain vaccines, such as: Influenza vaccine. This is recommended every year. Tetanus, diphtheria, and acellular pertussis (Tdap, Td) vaccine. You may need a Td booster every 10 years. Zoster vaccine. You may need this after age 42. Pneumococcal 13-valent conjugate (PCV13) vaccine. One dose is recommended after age 73. Pneumococcal polysaccharide (PPSV23) vaccine. One dose is recommended after age 34. Talk to your health care provider about which screenings and vaccines you need and how often you need them. This information is not intended to replace advice given to you by your health care provider. Make sure you discuss any questions you have with your health care provider. Document Released: 10/10/2015 Document Revised: 06/02/2016 Document Reviewed: 07/15/2015 Elsevier Interactive Patient Education  2017 [employer].  Fall Prevention in the Home Falls can cause injuries. They can happen to people of all ages. There are many things you can do to make your home safe and to help prevent falls. What can I do on the outside of my home? Regularly fix the edges of walkways and driveways and fix any cracks. Remove anything that might make you trip as you walk through a door, such as a raised step or threshold. Trim any bushes or trees on the path to your home. Use bright outdoor lighting. Clear any walking paths of anything that might make someone trip, such as rocks or tools. Regularly check to see if handrails are loose or broken. Make sure that both sides of any steps have handrails. Any raised decks and porches should have guardrails on the edges. Have any leaves, snow, or ice cleared regularly. Use sand or salt on walking paths during winter. Clean up any spills in your garage right away. This includes oil or grease spills. What can I do in the bathroom? Use night lights. Install grab bars by the toilet and in the tub  and shower. Do not use towel bars as grab bars. Use non-skid mats or decals in the tub or shower. If you need to sit down in the shower, use a plastic, non-slip stool. Keep the floor dry. Clean up any water that spills on the floor as soon as it happens. Remove soap buildup in the tub or shower regularly. Attach bath mats securely with double-sided non-slip rug tape. Do not have throw rugs and other things on the floor that can make you trip. What can I do in the bedroom? Use night lights. Make sure that you have a light by your bed that is easy to reach. Do not use any sheets or blankets that are too big for your bed. They should not hang down onto the floor. Have a firm chair that has side arms. You can use this for support while you get dressed. Do not have throw rugs and other things on the floor that can make you trip. What can I do in the kitchen? Clean up any spills right away. Avoid walking on wet floors. Keep items that you use a lot in easy-to-reach places. If you need to reach something above you, use a strong step  stool that has a grab bar. Keep electrical cords out of the way. Do not use floor polish or wax that makes floors slippery. If you must use wax, use non-skid floor wax. Do not have throw rugs and other things on the floor that can make you trip. What can I do with my stairs? Do not leave any items on the stairs. Make sure that there are handrails on both sides of the stairs and use them. Fix handrails that are broken or loose. Make sure that handrails are as long as the stairways. Check any carpeting to make sure that it is firmly attached to the stairs. Fix any carpet that is loose or worn. Avoid having throw rugs at the top or bottom of the stairs. If you do have throw rugs, attach them to the floor with carpet tape. Make sure that you have a light switch at the top of the stairs and the bottom of the stairs. If you do not have them, ask someone to add them for  you. What else can I do to help prevent falls? Wear shoes that: Do not have high heels. Have rubber bottoms. Are comfortable and fit you well. Are closed at the toe. Do not wear sandals. If you use a stepladder: Make sure that it is fully opened. Do not climb a closed stepladder. Make sure that both sides of the stepladder are locked into place. Ask someone to hold it for you, if possible. Clearly mark and make sure that you can see: Any grab bars or handrails. First and last steps. Where the edge of each step is. Use tools that help you move around (mobility aids) if they are needed. These include: Canes. Walkers. Scooters. Crutches. Turn on the lights when you go into a dark area. Replace any light bulbs as soon as they burn out. Set up your furniture so you have a clear path. Avoid moving your furniture around. If any of your floors are uneven, fix them. If there are any pets around you, be aware of where they are. Review your medicines with your doctor. Some medicines can make you feel dizzy. This can increase your chance of falling. Ask your doctor what other things that you can do to help prevent falls. This information is not intended to replace advice given to you by your health care provider. Make sure you discuss any questions you have with your health care provider. Document Released: 07/10/2009 Document Revised: 02/19/2016 Document Reviewed: 10/18/2014 Elsevier Interactive Patient Education  2017 [employer].

## 2022-09-17 NOTE — Progress Notes (Signed)
Virtual Visit via Telephone Note  I connected with  Taylor Morton on 09/17/22 at  8:45 AM EST by telephone and verified that I am speaking with the correct person using two identifiers.  Location: Patient: Home Provider: [place] Persons participating in the virtual visit: [place]   I discussed the limitations, risks, security and privacy concerns of performing an evaluation and management service by telephone and the availability of in person appointments. The patient expressed understanding and agreed to proceed.  Interactive audio and video telecommunications were attempted between this nurse and patient, however failed, due to patient having technical difficulties OR patient did not have access to video capability.  We continued and completed visit with audio only.  Some vital signs may be absent or patient reported.   Sheral Flow, LPN  Subjective:   Taylor Morton is a 66 y.o. male who presents for an Initial Medicare Annual Wellness Visit.  Review of Systems     Cardiac Risk Factors include: advanced age (>67men, >80 women);male gender     Objective:    Today's Vitals   09/17/22 0847  Weight: 192 lb (87.1 kg)  Height: 6' 1.25" (1.861 m)  PainSc: 0-No pain   Body mass index is 25.16 kg/m.     09/17/2022    8:51 AM 02/25/2022    1:04 AM  Advanced Directives  Does Patient Have a Medical Advance Directive? No No  Would patient like information on creating a medical advance directive? No - Patient declined No - Patient declined    Current Medications (verified) Outpatient Encounter Medications as of 09/17/2022  Medication Sig   alfuzosin (UROXATRAL) 10 MG 24 hr tablet Take 10 mg by mouth daily.   finasteride (PROSCAR) 5 MG tablet Take 5 mg by mouth daily.   IBUPROFEN PO Take by mouth as needed.   tadalafil (CIALIS) 20 MG tablet Take 0.5 tablets (10 mg total) by mouth daily as needed for erectile dysfunction.   [DISCONTINUED]  bethanechol (URECHOLINE) 25 MG tablet Take 25 mg by mouth 4 (four) times daily.   [DISCONTINUED] ciprofloxacin (CIPRO) 500 MG tablet Take 500 mg by mouth 2 (two) times daily.   [DISCONTINUED] diclofenac Sodium (VOLTAREN) 1 % GEL Apply 2 g topically 4 (four) times daily. Rub into affected area of foot 2 to 4 times daily (Patient not taking: Reported on 01/28/2021)   [DISCONTINUED] rosuvastatin (CRESTOR) 10 MG tablet Take 1 tablet (10 mg total) by mouth daily.   [DISCONTINUED] sulfamethoxazole-trimethoprim (BACTRIM DS) 800-160 MG tablet Take 1 tablet by mouth 2 (two) times daily.   [DISCONTINUED] tamsulosin (FLOMAX) 0.4 MG CAPS capsule Take 0.8 mg by mouth daily.   No facility-administered encounter medications on file as of 09/17/2022.    Allergies (verified) Dust mite extract   History: Past Medical History:  Diagnosis Date   Asthma    caused by dust - no inhaler   Bradycardia    Chest pain    no current problems   COPD (chronic obstructive pulmonary disease) (HCC)    Past Surgical History:  Procedure Laterality Date   APPENDECTOMY     Family History  Problem Relation Age of Onset   Cancer Father    Arthritis Mother    Colon cancer Neg Hx    Rectal cancer Neg Hx    Stomach cancer Neg Hx    Social History   Socioeconomic History   Marital status: Married    Spouse name: Hinton Dyer   Number of children: 2  Years of education: 55   Highest education level: Not on file  Occupational History   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[employer]    Comment: Earth Engineer, water Issues  Tobacco Use   Smoking status: Former    Packs/day: 1.00    Years: 10.00    Total pack years: 10.00    Types: Cigarettes    Quit date: 1992    Years since quitting: 31.9   Smokeless tobacco: Never  Vaping Use   Vaping Use: Never used  Substance and Sexual Activity   Alcohol use: Yes    Alcohol/week: 7.0 - 14.0 standard drinks of alcohol    Types: 7 - 14 Standard  drinks or equivalent per week    Comment: 1-2 day beer / liquor   Drug use: No   Sexual activity: Yes  Other Topics Concern   Not on file  Social History Narrative   Lives with his wife.  Sons are both in college.   Social Determinants of Health   Financial Resource Strain: Low Risk  (09/17/2022)   Overall Financial Resource Strain (CARDIA)    Difficulty of Paying Living Expenses: Not hard at all  Food Insecurity: No Food Insecurity (09/17/2022)   Hunger Vital Sign    Worried About Running Out of Food in the Last Year: Never true    Ran Out of Food in the Last Year: Never true  Transportation Needs: No Transportation Needs (09/17/2022)   PRAPARE - Administrator, Civil Service (Medical): No    Lack of Transportation (Non-Medical): No  Physical Activity: Sufficiently Active (09/17/2022)   Exercise Vital Sign    Days of Exercise per Week: 7 days    Minutes of Exercise per Session: 30 min  Stress: No Stress Concern Present (09/17/2022)   [employer] of Occupational Health - Occupational Stress Questionnaire    Feeling of Stress : Not at all  Social Connections: Socially Integrated (09/17/2022)   Social Connection and Isolation Panel [NHANES]    Frequency of Communication with Friends and Family: More than three times a week    Frequency of Social Gatherings with Friends and Family: More than three times a week    Attends Religious Services: More than 4 times per year    Active Member of [employer] or Organizations: Yes    Attends Engineer, structural: More than 4 times per year    Marital Status: Married    Tobacco Counseling Counseling given: Not Answered   Clinical Intake:  Pre-visit preparation completed: Yes  Pain : No/denies pain Pain Score: 0-No pain     BMI - recorded: 25.16 Nutritional Status: BMI 25 -29 Overweight Nutritional Risks: None Diabetes: No  How often do you need to have someone help you when you read instructions,  pamphlets, or other written materials from your doctor or pharmacy?: 1 - Never What is the 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 No  Interpreter Needed?: No  Information entered by :: Susie Cassette, LPN.   Activities of Daily Living    09/17/2022    8:54 AM  In your present state of health, do you have any difficulty performing the following activities:  Hearing? 0  Vision? 0  Difficulty concentrating or making decisions? 0  Walking or climbing stairs? 0  Dressing or bathing? 0  Doing errands, shopping? 0  Preparing Food and eating ? N  Using the Toilet? N  In the past six months, have you accidently leaked urine? N  Do you have problems with loss of bowel control? N  Managing your Medications? N  Managing your Finances? N  Housekeeping or managing your Housekeeping? N    Patient Care Team: Georgina Quint, MD as PCP - General (Internal Medicine)  Indicate any recent Medical Services you may have received from other than [facility] providers in the past year (date may be approximate).     Assessment:   This is a routine wellness examination for Yetzael.  Hearing/Vision screen Hearing Screening - Comments:: Denies hearing difficulties   Vision Screening - Comments:: Wears rx glasses - up to date with routine eye exams with Dr. Sinda Du   Dietary issues and exercise activities discussed: Current Exercise Habits: Home exercise routine;Structured exercise class, Type of exercise: walking;treadmill;stretching;strength training/weights;exercise ball;calisthenics, Time (Minutes): 60, Frequency (Times/Week): 7, Weekly Exercise (Minutes/Week): 420, Intensity: Moderate, Exercise limited by: None identified   Goals Addressed             This Visit's Progress    To maintain my current health status by continuing to eat healthy, stay physically active and socially active.        Depression Screen    09/17/2022     8:54 AM 04/28/2022   11:27 AM 01/28/2021   11:18 AM 03/11/2020   10:07 AM 01/16/2020   10:53 AM 12/18/2019    4:26 PM 10/04/2018    8:22 AM  PHQ 2/9 Scores  PHQ - 2 Score 0 0 3 0 0 0 0  PHQ- 9 Score   9        Fall Risk    09/17/2022    8:52 AM 04/28/2022   11:27 AM 01/28/2021    9:45 AM 03/11/2020   10:07 AM 01/16/2020   10:53 AM  Fall Risk   Falls in the past year? 0 0 1 0 0  Number falls in past yr: 0  0 0   Injury with Fall? 0   0   Risk for fall due to : No Fall Risks  No Fall Risks    Follow up Falls prevention discussed  Falls evaluation completed Falls evaluation completed Falls evaluation completed    FALL RISK PREVENTION PERTAINING TO THE HOME:  Any stairs in or around the home? Yes  If so, are there any without handrails? No  Home free of loose throw rugs in walkways, pet beds, electrical cords, etc? Yes  Adequate lighting in your home to reduce risk of falls? Yes   ASSISTIVE DEVICES UTILIZED TO PREVENT FALLS:  Life alert? No  Use of a cane, walker or w/c? No  Grab bars in the bathroom? No  Shower chair or bench in shower? No  Elevated toilet seat or a handicapped toilet? No   TIMED UP AND XI:DHWYS VISIT   Was the test performed? No .   Cognitive Function:        09/17/2022    9:04 AM  6CIT Screen  What Year? 0 points  What month? 0 points  What time? 0 points  Count back from 20 0 points  Months in reverse 0 points  Repeat phrase 0 points  Total Score 0 points    Immunizations Immunization History  Administered Date(s) Administered   PFIZER(Purple Top)SARS-COV-2 Vaccination 11/24/2019, 12/26/2019, 08/28/2020, 12/13/2021    TDAP status: Up to date (per patient; need documentation)  Flu Vaccine status: Declined, Education has been provided regarding the importance of this  vaccine but patient still declined. Advised may receive this vaccine at local pharmacy or Health Dept. Aware to provide a copy of the vaccination record if obtained from local  pharmacy or Health Dept. Verbalized acceptance and understanding.  Pneumococcal vaccine status: Declined,  Education has been provided regarding the importance of this vaccine but patient still declined. Advised may receive this vaccine at local pharmacy or Health Dept. Aware to provide a copy of the vaccination record if obtained from local pharmacy or Health Dept. Verbalized acceptance and understanding.   Covid-19 vaccine status: Completed vaccines  Qualifies for Shingles Vaccine? Yes   Zostavax completed No   Shingrix Completed?: No.    Education has been provided regarding the importance of this vaccine. Patient has been advised to call insurance company to determine out of pocket expense if they have not yet received this vaccine. Advised may also receive vaccine at local pharmacy or Health Dept. Verbalized acceptance and understanding.  Screening Tests Health Maintenance  Topic Date Due   DTaP/Tdap/Td (1 - Tdap) Never done   INFLUENZA VACCINE  Never done   COVID-19 Vaccine (5 - 2023-24 season) 05/28/2022   Zoster Vaccines- Shingrix (1 of 2) 10/29/2022 (Originally 04/24/2005)   Pneumonia Vaccine 67+ Years old (1 - PCV) 04/29/2023 (Originally 04/24/2020)   Medicare Annual Wellness (AWV)  09/18/2023   COLONOSCOPY (Pts 45-51yrs Insurance coverage will need to be confirmed)  03/03/2030   Hepatitis C Screening  Completed   HPV VACCINES  Aged Out    Health Maintenance  Health Maintenance Due  Topic Date Due   DTaP/Tdap/Td (1 - Tdap) Never done   INFLUENZA VACCINE  Never done   COVID-19 Vaccine (5 - 2023-24 season) 05/28/2022    Colorectal cancer screening: Type of screening: Colonoscopy. Completed 03/03/2020. Repeat every 10 years  Lung Cancer Screening: (Low Dose CT Chest recommended if Age 20-80 years, 30 pack-year currently smoking OR have quit w/in 15years.) does not qualify.   Lung Cancer Screening Referral: no  Additional Screening:  Hepatitis C Screening: does qualify;  Completed 10/04/2018  Vision Screening: Recommended annual ophthalmology exams for early detection of glaucoma and other disorders of the eye. Is the patient up to date with their annual eye exam?  Yes  Who is the provider or what is the name of the office in which the patient attends annual eye exams? Jola Schmidt, MD. If pt is not established with a provider, would they like to be referred to a provider to establish care? No .   Dental Screening: Recommended annual dental exams for proper oral hygiene  Community Resource Referral / Chronic Care Management: CRR required this visit?  No   CCM required this visit?  No      Plan:     I have personally reviewed and noted the following in the patient's chart:   Medical and social history Use of alcohol, tobacco or illicit drugs  Current medications and supplements including opioid prescriptions. Patient is not currently taking opioid prescriptions. Functional ability and status Nutritional status Physical activity Advanced directives List of other physicians Hospitalizations, surgeries, and ER visits in previous 12 months Vitals Screenings to include cognitive, depression, and falls Referrals and appointments  In addition, I have reviewed and discussed with patient certain preventive protocols, quality metrics, and best practice recommendations. A written personalized care plan for preventive services as well as general preventive health recommendations were provided to patient.     Sheral Flow, LPN   [id number]   Nurse  Notes: N/A

## 2022-09-17 NOTE — Telephone Encounter (Signed)
Medication sent to patient pharmacy.

## 2022-09-24 ENCOUNTER — Telehealth: Payer: Medicare PPO | Admitting: Physician Assistant

## 2022-09-24 DIAGNOSIS — U071 COVID-19: Secondary | ICD-10-CM

## 2022-09-24 MED ORDER — FLUTICASONE PROPIONATE 50 MCG/ACT NA SUSP: 2.0000 | Freq: Every day | NASAL | 0 refills | Status: DC

## 2022-09-24 MED ORDER — NIRMATRELVIR/RITONAVIR (PAXLOVID)TABLET: 3.0000 | ORAL_TABLET | Freq: Two times a day (BID) | ORAL | 0 refills | Status: AC

## 2022-09-24 MED ORDER — PROMETHAZINE-DM 6.25-15 MG/5ML PO SYRP: 5.0000 mL | ORAL_SOLUTION | Freq: Four times a day (QID) | ORAL | 0 refills | Status: DC | PRN

## 2022-09-24 MED ORDER — BENZONATATE 100 MG PO CAPS: 100.0000 mg | ORAL_CAPSULE | Freq: Three times a day (TID) | ORAL | 0 refills | Status: DC | PRN

## 2022-09-24 NOTE — Progress Notes (Signed)
Virtual Visit Consent   Taylor Morton, you are scheduled for a virtual visit with a [facility] provider today. Just as with appointments in the office, your consent must be obtained to participate. Your consent will be active for this visit and any virtual visit you may have with one of our providers in the next 365 days. If you have a MyChart account, a copy of this consent can be sent to you electronically.  As this is a virtual visit, video technology does not allow for your provider to perform a traditional examination. This may limit your provider's ability to fully assess your condition. If your provider identifies any concerns that need to be evaluated in person or the need to arrange testing (such as labs, EKG, etc.), we will make arrangements to do so. Although advances in technology are sophisticated, we cannot ensure that it will always work on either your end or our end. If the connection with a video visit is poor, the visit may have to be switched to a telephone visit. With either a video or telephone visit, we are not always able to ensure that we have a secure connection.  By engaging in this virtual visit, you consent to the provision of healthcare and authorize for your insurance to be billed (if applicable) for the services provided during this visit. Depending on your insurance coverage, you may receive a charge related to this service.  I need to obtain your verbal consent now. Are you willing to proceed with your visit today? [name] has provided verbal consent on 09/24/2022 for a virtual visit (video or telephone). Margaretann Loveless, PA-C  Date: 09/24/2022 5:45 PM  Virtual Visit via Video Note   I, Margaretann Loveless, connected with  Taylor Morton  ([id number], 07/20/1956) on 09/24/22 at  5:45 PM EST by a video-enabled telemedicine application and verified that I am speaking with the correct person using two identifiers.  Location: Patient: Virtual Visit Location Patient:  Home Provider: Virtual Visit Location Provider: Home Office   I discussed the limitations of evaluation and management by telemedicine and the availability of in person appointments. The patient expressed understanding and agreed to proceed.    History of Present Illness: [name] is a 66 y.o. who identifies as a male who was assigned male at birth, and is being seen today for Covid 19.  HPI: URI  This is a new problem. Episode onset: Tested positive for Covid 19 on at home test yesterday morning; symptoms started 2 days ago. The problem has been gradually worsening. The maximum temperature recorded prior to his arrival was 101 - 101.9 F (101.4). The fever has been present for Less than 1 day. Associated symptoms include congestion, coughing, headaches, rhinorrhea and sinus pain. Pertinent negatives include no chest pain, diarrhea, ear pain, nausea, plugged ear sensation, sore throat or vomiting. Associated symptoms comments: Chills, myalgias. Treatments tried: robitussin dm, advil. The treatment provided no relief.      Problems:  Patient Active Problem List   Diagnosis Date Noted   History of BPH 04/28/2022   Coronary artery calcification 02/28/2017   Asthma     Allergies:  Allergies  Allergen Reactions   D[place] Mite Extract     cough   Medications:  Current Outpatient Medications:    benzonatate (TESSALON) 100 MG capsule, Take 1 capsule (100 mg total) by mouth 3 (three) times daily as needed., Disp: 30 capsule, Rfl: 0   fluticasone (FLONASE) 50 MCG/ACT nasal spray, Place 2  sprays into both nostrils daily., Disp: 16 g, Rfl: 0   nirmatrelvir/ritonavir (PAXLOVID) 20 x 150 MG & 10 x 100MG  TABS, Take 3 tablets by mouth 2 (two) times daily for 5 days. (Take nirmatrelvir 150 mg two tablets twice daily for 5 days and ritonavir 100 mg one tablet twice daily for 5 days) Patient GFR is 89, Disp: 30 tablet, Rfl: 0   promethazine-dextromethorphan (PROMETHAZINE-DM) 6.25-15 MG/5ML syrup, Take 5  mLs by mouth 4 (four) times daily as needed., Disp: 118 mL, Rfl: 0   alfuzosin (UROXATRAL) 10 MG 24 hr tablet, Take 10 mg by mouth daily., Disp: , Rfl:    finasteride (PROSCAR) 5 MG tablet, Take 5 mg by mouth daily., Disp: , Rfl:    IBUPROFEN PO, Take by mouth as needed., Disp: , Rfl:    tadalafil (CIALIS) 20 MG tablet, Take 0.5 tablets (10 mg total) by mouth daily as needed for erectile dysfunction., Disp: 10 tablet, Rfl: 5  Observations/Objective: Patient is well-developed, well-nourished in no acute distress.  Resting comfortably  Head is normocephalic, atraumatic.  No labored breathing.  Speech is clear and coherent with logical content.  Patient is alert and oriented at baseline.    Assessment and Plan: 1. COVID-19 - nirmatrelvir/ritonavir (PAXLOVID) 20 x 150 MG & 10 x 100MG  TABS; Take 3 tablets by mouth 2 (two) times daily for 5 days. (Take nirmatrelvir 150 mg two tablets twice daily for 5 days and ritonavir 100 mg one tablet twice daily for 5 days) Patient GFR is 89  Dispense: 30 tablet; Refill: 0 - MyChart COVID-19 home monitoring program; Future - fluticasone (FLONASE) 50 MCG/ACT nasal spray; Place 2 sprays into both nostrils daily.  Dispense: 16 g; Refill: 0 - promethazine-dextromethorphan (PROMETHAZINE-DM) 6.25-15 MG/5ML syrup; Take 5 mLs by mouth 4 (four) times daily as needed.  Dispense: 118 mL; Refill: 0 - benzonatate (TESSALON) 100 MG capsule; Take 1 capsule (100 mg total) by mouth 3 (three) times daily as needed.  Dispense: 30 capsule; Refill: 0  - Continue OTC symptomatic management of choice - Will send OTC vitamins and supplement information through AVS - Paxlovid, Promethazine DM, Tessalon perles, and Flonase prescribed - Patient enrolled in MyChart symptom monitoring - Push fluids - Rest as needed - Discussed return precautions and when to seek in-person evaluation, sent via AVS as well   Follow Up Instructions: I discussed the assessment and treatment plan with  the patient. The patient was provided an opportunity to ask questions and all were answered. The patient agreed with the plan and demonstrated an understanding of the instructions.  A copy of instructions were sent to the patient via MyChart unless otherwise noted below.    The patient was advised to call back or seek an in-person evaluation if the symptoms worsen or if the condition fails to improve as anticipated.  Time:  I spent 12 minutes with the patient via telehealth technology discussing the above problems/concerns.    , PA-C

## 2022-09-24 NOTE — Patient Instructions (Signed)
Taylor Morton, thank you for joining Margaretann Loveless, PA-C for today's virtual visit.  While this provider is not your primary care provider (PCP), if your PCP is located in our provider database this encounter information will be shared with them immediately following your visit.   A [facility] MyChart account gives you access to today's visit and all your visits, tests, and labs performed at [facility] " click here if you don't have a [facility] MyChart account or go to [email or web address]  Consent: (Patient) Taylor Morton provided verbal consent for this virtual visit at the beginning of the encounter.  Current Medications:  Current Outpatient Medications:    benzonatate (TESSALON) 100 MG capsule, Take 1 capsule (100 mg total) by mouth 3 (three) times daily as needed., Disp: 30 capsule, Rfl: 0   fluticasone (FLONASE) 50 MCG/ACT nasal spray, Place 2 sprays into both nostrils daily., Disp: 16 g, Rfl: 0   nirmatrelvir/ritonavir (PAXLOVID) 20 x 150 MG & 10 x 100MG  TABS, Take 3 tablets by mouth 2 (two) times daily for 5 days. (Take nirmatrelvir 150 mg two tablets twice daily for 5 days and ritonavir 100 mg one tablet twice daily for 5 days) Patient GFR is 89, Disp: 30 tablet, Rfl: 0   promethazine-dextromethorphan (PROMETHAZINE-DM) 6.25-15 MG/5ML syrup, Take 5 mLs by mouth 4 (four) times daily as needed., Disp: 118 mL, Rfl: 0   alfuzosin (UROXATRAL) 10 MG 24 hr tablet, Take 10 mg by mouth daily., Disp: , Rfl:    finasteride (PROSCAR) 5 MG tablet, Take 5 mg by mouth daily., Disp: , Rfl:    IBUPROFEN PO, Take by mouth as needed., Disp: , Rfl:    tadalafil (CIALIS) 20 MG tablet, Take 0.5 tablets (10 mg total) by mouth daily as needed for erectile dysfunction., Disp: 10 tablet, Rfl: 5   Medications ordered in this encounter:  Meds ordered this encounter  Medications   nirmatrelvir/ritonavir (PAXLOVID) 20 x 150 MG & 10 x 100MG  TABS    Sig: Take 3 tablets by mouth 2 (two) times  daily for 5 days. (Take nirmatrelvir 150 mg two tablets twice daily for 5 days and ritonavir 100 mg one tablet twice daily for 5 days) Patient GFR is 89    Dispense:  30 tablet    Refill:  0    Order Specific Question:   Supervising Provider    Answer:   11-29-1991   fluticasone (FLONASE) 50 MCG/ACT nasal spray    Sig: Place 2 sprays into both nostrils daily.    Dispense:  16 g    Refill:  0    Order Specific Question:   Supervising Provider    Answer:   Merrilee Jansky [[id number]]   promethazine-dextromethorphan (PROMETHAZINE-DM) 6.25-15 MG/5ML syrup    Sig: Take 5 mLs by mouth 4 (four) times daily as needed.    Dispense:  118 mL    Refill:  0    Order Specific Question:   Supervising Provider    Answer:   [id number] 11-29-1991   benzonatate (TESSALON) 100 MG capsule    Sig: Take 1 capsule (100 mg total) by mouth 3 (three) times daily as needed.    Dispense:  30 capsule    Refill:  0    Order Specific Question:   Supervising Provider    Answer:   Merrilee Jansky [[id number]]     *If you need refills on other medications prior to your next appointment, please contact your pharmacy*  Follow-Up: Call back or seek an in-person evaluation if the symptoms worsen or if the condition fails to improve as anticipated.  [facility] [phone]  Other Instructions  Can take to lessen severity: Vit C 500mg  twice daily Quercertin 250-500mg  twice daily Zinc 75-100mg  daily Melatonin 3-6 mg at bedtime Vit D3 1000-2000 IU daily Aspirin 81 mg daily with food Optional: Famotidine 20mg  daily Also can add tylenol/ibuprofen as needed for fevers and body aches May add Mucinex or Mucinex DM as needed for cough/congestion   COVID-19 COVID-19, or coronavirus disease 2019, is an infection that is caused by a new (novel) coronavirus called SARS-CoV-2. COVID-19 can cause many symptoms. In some people, the virus may not cause any symptoms. In others, it may  cause mild or severe symptoms. Some people with severe infection develop severe disease. What are the causes? This illness is caused by a virus. The virus may be in the air as tiny specks of fluid (aerosols) or droplets, or it may be on surfaces. You may catch the virus by: Breathing in droplets from an infected person. Droplets can be spread by a person breathing, speaking, singing, coughing, or sneezing. Touching something, like a table or a doorknob, that has virus on it (is contaminated) and then touching your mouth, nose, or eyes. What increases the risk? Risk for infection: You are more likely to get infected with the COVID-19 virus if: You are within 6 ft (1.8 m) of a person with COVID-19 for 15 minutes or longer. You are providing care for a person who is infected with COVID-19. You are in close personal contact with other people. Close personal contact includes hugging, kissing, or sharing eating or drinking utensils. Risk for serious illness caused by COVID-19: You are more likely to get seriously ill from the COVID-19 virus if: You have cancer. You have a long-term (chronic) disease, such as: Chronic lung disease. This includes pulmonary embolism, chronic obstructive pulmonary disease, and cystic fibrosis. Long-term disease that lowers your body's ability to fight infection (immunocompromise). Serious cardiac conditions, such as heart failure, coronary artery disease, or cardiomyopathy. Diabetes. Chronic kidney disease. Liver diseases. These include cirrhosis, nonalcoholic fatty liver disease, alcoholic liver disease, or autoimmune hepatitis. You have obesity. You are pregnant or were recently pregnant. You have sickle cell disease. What are the signs or symptoms? Symptoms of this condition can range from mild to severe. Symptoms may appear any time from 2 to 14 days after being exposed to the virus. They include: Fever or chills. Shortness of breath or trouble  breathing. Feeling tired or very tired. Headaches, body aches, or muscle aches. Runny or stuffy nose, sneezing, coughing, or sore throat. New loss of taste or smell. This is rare. Some people may also have stomach problems, such as nausea, vomiting, or diarrhea. Other people may not have any symptoms of COVID-19. How is this diagnosed? This condition may be diagnosed by testing samples to check for the COVID-19 virus. The most common tests are the PCR test and the antigen test. Tests may be done in the lab or at home. They include: Using a swab to take a sample of fluid from the back of your nose and throat (nasopharyngeal fluid), from your nose, or from your throat. Testing a sample of saliva from your mouth. Testing a sample of coughed-up mucus from your lungs (sputum). How is this treated? Treatment for COVID-19 infection depends on the severity of the condition. Mild symptoms can be managed at home with  rest, fluids, and over-the-counter medicines. Serious symptoms may be treated in a hospital intensive care unit (ICU). Treatment in the ICU may include: Supplemental oxygen. Extra oxygen is given through a tube in the nose, a face mask, or a hood. Medicines. These may include: Antivirals, such as monoclonal antibodies. These help your body fight off certain viruses that can cause disease. Anti-inflammatories, such as corticosteroids. These reduce inflammation and suppress the immune system. Antithrombotics. These prevent or treat blood clots, if they develop. Convalescent plasma. This helps boost your immune system, if you have an underlying immunosuppressive condition or are getting immunosuppressive treatments. Prone positioning. This means you will lie on your stomach. This helps oxygen to get into your lungs. Infection control measures. If you are at risk for more serious illness caused by COVID-19, your health care provider may prescribe two long-acting monoclonal antibodies, given  together every 6 months. How is this prevented? To protect yourself: Use preventive medicine (pre-exposure prophylaxis). You may get pre-exposure prophylaxis if you have moderate or severe immunocompromise. Get vaccinated. Anyone 25 months old or older who meets guidelines can get a COVID-19 vaccine or vaccine series. This includes people who are pregnant or making breast milk (lactating). Get an added dose of COVID-19 vaccine after your first vaccine or vaccine series if you have moderate to severe immunocompromise. This applies if you have had a solid organ transplant or have been diagnosed with an immunocompromising condition. You should get the added dose 4 weeks after you got the first COVID-19 vaccine or vaccine series. If you get an mRNA vaccine, you will need a 3-dose primary series. If you get the J&J/Janssen vaccine, you will need a 2-dose primary series, with the second dose being an mRNA vaccine. Talk to your health care provider about getting experimental monoclonal antibodies. This treatment is approved under emergency use authorization to prevent severe illness before or after being exposed to the COVID-19 virus. You may be given monoclonal antibodies if: You have moderate or severe immunocompromise. This includes treatments that lower your immune response. People with immunocompromise may not develop protection against COVID-19 when they are vaccinated. You cannot be vaccinated. You may not get a vaccine if you have a severe allergic reaction to the vaccine or its components. You are not fully vaccinated. You are in a facility where COVID-19 is present and: Are in close contact with a person who is infected with the COVID-19 virus. Are at high risk of being exposed to the COVID-19 virus. You are at risk of illness from new variants of the COVID-19 virus. To protect others: If you have symptoms of COVID-19, take steps to prevent the virus from spreading to others. Stay home. Leave  your house only to get medical care. Do not use public transit, if possible. Do not travel while you are sick. Wash your hands often with soap and water for at least 20 seconds. If soap and water are not available, use alcohol-based hand sanitizer. Make sure that all people in your household wash their hands well and often. Cough or sneeze into a tissue or your sleeve or elbow. Do not cough or sneeze into your hand or into the air. Where to find more information Centers for Disease Control and Prevention: CharmCourses.be World Health Organization: https://www.castaneda.info/ Get help right away if: You have trouble breathing. You have pain or pressure in your chest. You are confused. You have bluish lips and fingernails. You have trouble waking from sleep. You have symptoms that get worse. These  symptoms may be an emergency. Get help right away. Call 911. Do not wait to see if the symptoms will go away. Do not drive yourself to the hospital. Summary COVID-19 is an infection that is caused by a new coronavirus. Sometimes, there are no symptoms. Other times, symptoms range from mild to severe. Some people with a severe COVID-19 infection develop severe disease. The virus that causes COVID-19 can spread from person to person through droplets or aerosols from breathing, speaking, singing, coughing, or sneezing. Mild symptoms of COVID-19 can be managed at home with rest, fluids, and over-the-counter medicines. This information is not intended to replace advice given to you by your health care provider. Make sure you discuss any questions you have with your health care provider. Document Revised: 09/01/2021 Document Reviewed: 09/03/2021 Elsevier Patient Education  [place].    If you have been instructed to have an in-person evaluation today at a local Urgent Care facility, please use the link below. It will take you to a list of all of our available [facility], including address, phone number and hours of operation. Please do not delay care.  [facility]  If you or a family member do not have a primary care provider, use the link below to schedule a visit and establish care. When you choose a [facility] primary care physician or advanced practice provider, you gain a long-term partner in health. Find a Primary Care Provider  Learn more about [facility]'s in-office and virtual care options: [facility]

## 2022-10-25 ENCOUNTER — Other Ambulatory Visit (INDEPENDENT_AMBULATORY_CARE_PROVIDER_SITE_OTHER): Payer: Medicare PPO

## 2022-10-25 DIAGNOSIS — Z1329 Encounter for screening for other suspected endocrine disorder: Secondary | ICD-10-CM

## 2022-10-25 DIAGNOSIS — Z13228 Encounter for screening for other metabolic disorders: Secondary | ICD-10-CM | POA: Diagnosis not present

## 2022-10-25 DIAGNOSIS — Z87438 Personal history of other diseases of male genital organs: Secondary | ICD-10-CM

## 2022-10-25 DIAGNOSIS — Z13220 Encounter for screening for lipoid disorders: Secondary | ICD-10-CM

## 2022-10-25 DIAGNOSIS — Z130 Encounter for screening for diseases of the blood and blood-forming organs and certain disorders involving the immune mechanism: Secondary | ICD-10-CM | POA: Diagnosis not present

## 2022-10-25 LAB — COMPREHENSIVE METABOLIC PANEL
ALT: 25 U/L (ref 0–53)
AST: 27 U/L (ref 0–37)
Albumin: 4.6 g/dL (ref 3.5–5.2)
Alkaline Phosphatase: 80 U/L (ref 39–117)
BUN: 20 mg/dL (ref 6–23)
CO2: 29 mEq/L (ref 19–32)
Calcium: 9.4 mg/dL (ref 8.4–10.5)
Chloride: 101 mEq/L (ref 96–112)
Creatinine, Ser: 0.86 mg/dL (ref 0.40–1.50)
GFR: 89.6 mL/min (ref >60.00)
Glucose, Bld: 91 mg/dL (ref 70–99)
Potassium: 5 mEq/L (ref 3.5–5.1)
Sodium: 137 mEq/L (ref 135–145)
Total Bilirubin: 1.3 mg/dL — ABNORMAL HIGH (ref 0.2–1.2)
Total Protein: 7 g/dL (ref 6.0–8.3)

## 2022-10-25 LAB — CBC WITH DIFFERENTIAL/PLATELET
Basophils Absolute: 0 10*3/uL (ref 0.0–0.1)
Basophils Relative: 0.4 % (ref 0.0–3.0)
Eosinophils Absolute: 0.1 10*3/uL (ref 0.0–0.7)
Eosinophils Relative: 1.9 % (ref 0.0–5.0)
HCT: 41.8 % (ref 39.0–52.0)
Hemoglobin: 14.5 g/dL (ref 13.0–17.0)
Lymphocytes Relative: 30.8 % (ref 12.0–46.0)
Lymphs Abs: 1.3 10*3/uL (ref 0.7–4.0)
MCHC: 34.7 g/dL (ref 30.0–36.0)
MCV: 91.4 fl (ref 78.0–100.0)
Monocytes Absolute: 0.4 10*3/uL (ref 0.1–1.0)
Monocytes Relative: 9.7 % (ref 3.0–12.0)
Neutro Abs: 2.4 10*3/uL (ref 1.4–7.7)
Neutrophils Relative %: 57.2 % (ref 43.0–77.0)
Platelets: 202 10*3/uL (ref 150.0–400.0)
RBC: 4.58 Mil/uL (ref 4.22–5.81)
RDW: 12.6 % (ref 11.5–15.5)
WBC: 4.1 10*3/uL (ref 4.0–10.5)

## 2022-10-25 LAB — LIPID PANEL
Cholesterol: 197 mg/dL (ref 0–200)
HDL: 52.8 mg/dL (ref >39.00)
LDL Cholesterol: 125 mg/dL — ABNORMAL HIGH (ref 0–99)
NonHDL: 143.9
Total CHOL/HDL Ratio: 4
Triglycerides: 96 mg/dL (ref 0.0–149.0)
VLDL: 19.2 mg/dL (ref 0.0–40.0)

## 2022-10-25 LAB — PSA: PSA: 1.04 ng/mL (ref 0.10–4.00)

## 2022-10-25 LAB — HEMOGLOBIN A1C: Hgb A1c MFr Bld: 5.5 % (ref 4.6–6.5)

## 2022-10-26 ENCOUNTER — Other Ambulatory Visit: Payer: Medicare PPO

## 2022-11-01 ENCOUNTER — Encounter: Payer: Self-pay | Admitting: Emergency Medicine

## 2022-11-01 ENCOUNTER — Ambulatory Visit (INDEPENDENT_AMBULATORY_CARE_PROVIDER_SITE_OTHER): Payer: Medicare PPO | Admitting: Emergency Medicine

## 2022-11-01 VITALS — BP 122/66 | HR 78 | Temp 98.1°F | Ht 73.25 in | Wt 189.0 lb

## 2022-11-01 DIAGNOSIS — Z0000 Encounter for general adult medical examination without abnormal findings: Secondary | ICD-10-CM

## 2022-11-01 NOTE — Progress Notes (Signed)
Taylor Morton 67 y.o.   Chief Complaint  Patient presents with   Follow-up    f/u appt, no concerns     HISTORY OF PRESENT ILLNESS: This is a 67 y.o. male here for annual exam. Has no complaints or medical concerns today. Recent blood work done last week reviewed with patient. Normal results with acceptable values.  No concerns.  HPI   Prior to Admission medications   Medication Sig Start Date End Date Taking? Authorizing Provider  alfuzosin (UROXATRAL) 10 MG 24 hr tablet Take 10 mg by mouth daily. 08/21/22  Yes [provider]  finasteride (PROSCAR) 5 MG tablet Take 5 mg by mouth daily. 12/30/21  Yes [provider]  tadalafil (CIALIS) 20 MG tablet Take 0.5 tablets (10 mg total) by mouth daily as needed for erectile dysfunction. 09/17/22  Yes Georgina Quint, MD    Allergies  Allergen Reactions   Dust Mite Extract     cough    Patient Active Problem List   Diagnosis Date Noted   History of BPH 04/28/2022   Coronary artery calcification 02/28/2017   Asthma     Past Medical History:  Diagnosis Date   Asthma    caused by dust - no inhaler   Bradycardia    Chest pain    no current problems   COPD (chronic obstructive pulmonary disease) (HCC)     Past Surgical History:  Procedure Laterality Date   APPENDECTOMY      Social History   Socioeconomic History   Marital status: Married    Spouse name: Annabelle Harman   Number of children: 2   Years of education: 16   Highest education level: Not on file  Occupational History   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[employer]    Comment: Earth Engineer, water Issues  Tobacco Use   Smoking status: Former    Packs/day: 1.00    Years: 10.00    Total pack years: 10.00    Types: Cigarettes    Quit date: 1992    Years since quitting: 32.1   Smokeless tobacco: Never  Vaping Use   Vaping Use: Never used  Substance and Sexual Activity   Alcohol use: Yes    Alcohol/week:  7.0 - 14.0 standard drinks of alcohol    Types: 7 - 14 Standard drinks or equivalent per week    Comment: 1-2 day beer / liquor   Drug use: No   Sexual activity: Yes  Other Topics Concern   Not on file  Social History Narrative   Lives with his wife.  Sons are both in college.   Social Determinants of Health   Financial Resource Strain: Low Risk  (09/17/2022)   Overall Financial Resource Strain (CARDIA)    Difficulty of Paying Living Expenses: Not hard at all  Food Insecurity: No Food Insecurity (09/17/2022)   Hunger Vital Sign    Worried About Running Out of Food in the Last Year: Never true    Ran Out of Food in the Last Year: Never true  Transportation Needs: No Transportation Needs (09/17/2022)   PRAPARE - Administrator, Civil Service (Medical): No    Lack of Transportation (Non-Medical): No  Physical Activity: Sufficiently Active (09/17/2022)   Exercise Vital Sign    Days of Exercise per Week: 7 days    Minutes of Exercise per Session: 30 min  Stress: No Stress Concern Present (09/17/2022)   [employer] of Occupational  Health - Occupational Stress Questionnaire    Feeling of Stress : Not at all  Social Connections: Socially Integrated (09/17/2022)   Social Connection and Isolation Panel [NHANES]    Frequency of Communication with Friends and Family: More than three times a week    Frequency of Social Gatherings with Friends and Family: More than three times a week    Attends Religious Services: More than 4 times per year    Active Member of [employer] or Organizations: Yes    Attends Music therapist: More than 4 times per year    Marital Status: Married  Human resources officer Violence: Not At Risk (09/17/2022)   Humiliation, Afraid, Rape, and Kick questionnaire    Fear of Current or Ex-Partner: No    Emotionally Abused: No    Physically Abused: No    Sexually Abused: No    Family History  Problem Relation Age of Onset   Cancer Father     Arthritis Mother    Colon cancer Neg Hx    Rectal cancer Neg Hx    Stomach cancer Neg Hx      Review of Systems  Constitutional: Negative.  Negative for chills and fever.  HENT: Negative.  Negative for congestion and sore throat.   Eyes: Negative.   Respiratory: Negative.  Negative for cough and shortness of breath.   Cardiovascular: Negative.  Negative for chest pain and palpitations.  Gastrointestinal: Negative.  Negative for abdominal pain, nausea and vomiting.  Genitourinary: Negative.  Negative for dysuria and hematuria.  Musculoskeletal: Negative.   Skin: Negative.  Negative for rash.  Neurological: Negative.  Negative for dizziness and headaches.  All other systems reviewed and are negative.  Today's Vitals   11/01/22 1019  BP: 122/66  Pulse: 78  Temp: 98.1 F (36.7 C)  TempSrc: Oral  SpO2: 94%  Weight: 189 lb (85.7 kg)  Height: 6' 1.25" (1.861 m)   Body mass index is 24.77 kg/m.   Physical Exam Vitals reviewed.  Constitutional:      Appearance: Normal appearance.  HENT:     Head: Normocephalic.     Right Ear: Tympanic membrane, ear canal and external ear normal.     Left Ear: Tympanic membrane, ear canal and external ear normal.     Mouth/Throat:     Mouth: Mucous membranes are moist.     Pharynx: Oropharynx is clear.  Eyes:     Extraocular Movements: Extraocular movements intact.     Conjunctiva/sclera: Conjunctivae normal.     Pupils: Pupils are equal, round, and reactive to light.  Cardiovascular:     Rate and Rhythm: Normal rate and regular rhythm.     Pulses: Normal pulses.     Heart sounds: Normal heart sounds.  Pulmonary:     Effort: Pulmonary effort is normal.     Breath sounds: Normal breath sounds.  Musculoskeletal:     Cervical back: No tenderness.  Lymphadenopathy:     Cervical: No cervical adenopathy.  Skin:    General: Skin is warm and dry.  Neurological:     General: No focal deficit present.     Mental Status: He is alert and  oriented to person, place, and time.  Psychiatric:        Mood and Affect: Mood normal.        Behavior: Behavior normal.      ASSESSMENT & PLAN: Problem List Items Addressed This Visit   None Visit Diagnoses     Routine general medical  examination at a health care facility    -  Primary      Modifiable risk factors discussed with patient. Anticipatory guidance according to age provided. The following topics were also discussed: Social Determinants of Health Smoking.  Non-smoker Diet and nutrition.  Good eating habits Benefits of exercise.  Exercises regularly Cancer screening review of most recent colonoscopy report Vaccinations reviewed and recommendations Cardiovascular risk assessment The 10-year ASCVD risk score (Arnett DK, et al., 2019) is: 13.1%   Values used to calculate the score:     Age: 48 years     Sex: Male     Is Non-Hispanic African American: No     Diabetic: No     Tobacco smoker: No     Systolic Blood Pressure: 161 mmHg     Is BP treated: No     HDL Cholesterol: 52.8 mg/dL     Total Cholesterol: 197 mg/dL Review of most recent blood work results from last week.  Normal with acceptable values. Mental health including depression and anxiety Fall and accident prevention  Patient Instructions  Health Maintenance After Age 61 After age 55, you are at a higher risk for certain long-term diseases and infections as well as injuries from falls. Falls are a major cause of broken bones and head injuries in people who are older than age 57. Getting regular preventive care can help to keep you healthy and well. Preventive care includes getting regular testing and making lifestyle changes as recommended by your health care provider. Talk with your health care provider about: Which screenings and tests you should have. A screening is a test that checks for a disease when you have no symptoms. A diet and exercise plan that is right for you. What should I know about  screenings and tests to prevent falls? Screening and testing are the best ways to find a health problem early. Early diagnosis and treatment give you the best chance of managing medical conditions that are common after age 70. Certain conditions and lifestyle choices may make you more likely to have a fall. Your health care provider may recommend: Regular vision checks. Poor vision and conditions such as cataracts can make you more likely to have a fall. If you wear glasses, make sure to get your prescription updated if your vision changes. Medicine review. Work with your health care provider to regularly review all of the medicines you are taking, including over-the-counter medicines. Ask your health care provider about any side effects that may make you more likely to have a fall. Tell your health care provider if any medicines that you take make you feel dizzy or sleepy. Strength and balance checks. Your health care provider may recommend certain tests to check your strength and balance while standing, walking, or changing positions. Foot health exam. Foot pain and numbness, as well as not wearing proper footwear, can make you more likely to have a fall. Screenings, including: Osteoporosis screening. Osteoporosis is a condition that causes the bones to get weaker and break more easily. Blood pressure screening. Blood pressure changes and medicines to control blood pressure can make you feel dizzy. Depression screening. You may be more likely to have a fall if you have a fear of falling, feel depressed, or feel unable to do activities that you used to do. Alcohol use screening. Using too much alcohol can affect your balance and may make you more likely to have a fall. Follow these instructions at home: Lifestyle Do not drink alcohol  if: Your health care provider tells you not to drink. If you drink alcohol: Limit how much you have to: 0-1 drink a day for women. 0-2 drinks a day for men. Know how  much alcohol is in your drink. In the [place] one drink equals one 12 oz bottle of beer (355 mL), one 5 oz glass of wine (148 mL), or one 1 oz glass of hard liquor (44 mL). Do not use any products that contain nicotine or tobacco. These products include cigarettes, chewing tobacco, and vaping devices, such as e-cigarettes. If you need help quitting, ask your health care provider. Activity  Follow a regular exercise program to stay fit. This will help you maintain your balance. Ask your health care provider what types of exercise are appropriate for you. If you need a cane or walker, use it as recommended by your health care provider. Wear supportive shoes that have nonskid soles. Safety  Remove any tripping hazards, such as rugs, cords, and clutter. Install safety equipment such as grab bars in bathrooms and safety rails on stairs. Keep rooms and walkways well-lit. General instructions Talk with your health care provider about your risks for falling. Tell your health care provider if: You fall. Be sure to tell your health care provider about all falls, even ones that seem minor. You feel dizzy, tiredness (fatigue), or off-balance. Take over-the-counter and prescription medicines only as told by your health care provider. These include supplements. Eat a healthy diet and maintain a healthy weight. A healthy diet includes low-fat dairy products, low-fat (lean) meats, and fiber from whole grains, beans, and lots of fruits and vegetables. Stay current with your vaccines. Schedule regular health, dental, and eye exams. Summary Having a healthy lifestyle and getting preventive care can help to protect your health and wellness after age 53. Screening and testing are the best way to find a health problem early and help you avoid having a fall. Early diagnosis and treatment give you the best chance for managing medical conditions that are more common for people who are older than age 53. Falls are a  major cause of broken bones and head injuries in people who are older than age 47. Take precautions to prevent a fall at home. Work with your health care provider to learn what changes you can make to improve your health and wellness and to prevent falls. This information is not intended to replace advice given to you by your health care provider. Make sure you discuss any questions you have with your health care provider. Document Revised: 02/02/2021 Document Reviewed: 02/02/2021 Elsevier Patient Education  [place], MD [facility]

## 2022-11-01 NOTE — Patient Instructions (Signed)
Health Maintenance After Age 68 After age 68, you are at a higher risk for certain long-term diseases and infections as well as injuries from falls. Falls are a major cause of broken bones and head injuries in people who are older than age 68. Getting regular preventive care can help to keep you healthy and well. Preventive care includes getting regular testing and making lifestyle changes as recommended by your health care provider. Talk with your health care provider about: Which screenings and tests you should have. A screening is a test that checks for a disease when you have no symptoms. A diet and exercise plan that is right for you. What should I know about screenings and tests to prevent falls? Screening and testing are the best ways to find a health problem early. Early diagnosis and treatment give you the best chance of managing medical conditions that are common after age 68. Certain conditions and lifestyle choices may make you more likely to have a fall. Your health care provider may recommend: Regular vision checks. Poor vision and conditions such as cataracts can make you more likely to have a fall. If you wear glasses, make sure to get your prescription updated if your vision changes. Medicine review. Work with your health care provider to regularly review all of the medicines you are taking, including over-the-counter medicines. Ask your health care provider about any side effects that may make you more likely to have a fall. Tell your health care provider if any medicines that you take make you feel dizzy or sleepy. Strength and balance checks. Your health care provider may recommend certain tests to check your strength and balance while standing, walking, or changing positions. Foot health exam. Foot pain and numbness, as well as not wearing proper footwear, can make you more likely to have a fall. Screenings, including: Osteoporosis screening. Osteoporosis is a condition that causes  the bones to get weaker and break more easily. Blood pressure screening. Blood pressure changes and medicines to control blood pressure can make you feel dizzy. Depression screening. You may be more likely to have a fall if you have a fear of falling, feel depressed, or feel unable to do activities that you used to do. Alcohol use screening. Using too much alcohol can affect your balance and may make you more likely to have a fall. Follow these instructions at home: Lifestyle Do not drink alcohol if: Your health care provider tells you not to drink. If you drink alcohol: Limit how much you have to: 0-1 drink a day for women. 0-2 drinks a day for men. Know how much alcohol is in your drink. In the [place] one drink equals one 12 oz bottle of beer (355 mL), one 5 oz glass of wine (148 mL), or one 1 oz glass of hard liquor (44 mL). Do not use any products that contain nicotine or tobacco. These products include cigarettes, chewing tobacco, and vaping devices, such as e-cigarettes. If you need help quitting, ask your health care provider. Activity  Follow a regular exercise program to stay fit. This will help you maintain your balance. Ask your health care provider what types of exercise are appropriate for you. If you need a cane or walker, use it as recommended by your health care provider. Wear supportive shoes that have nonskid soles. Safety  Remove any tripping hazards, such as rugs, cords, and clutter. Install safety equipment such as grab bars in bathrooms and safety rails on stairs. Keep rooms and walkways   well-lit. General instructions Talk with your health care provider about your risks for falling. Tell your health care provider if: You fall. Be sure to tell your health care provider about all falls, even ones that seem minor. You feel dizzy, tiredness (fatigue), or off-balance. Take over-the-counter and prescription medicines only as told by your health care provider. These include  supplements. Eat a healthy diet and maintain a healthy weight. A healthy diet includes low-fat dairy products, low-fat (lean) meats, and fiber from whole grains, beans, and lots of fruits and vegetables. Stay current with your vaccines. Schedule regular health, dental, and eye exams. Summary Having a healthy lifestyle and getting preventive care can help to protect your health and wellness after age 68. Screening and testing are the best way to find a health problem early and help you avoid having a fall. Early diagnosis and treatment give you the best chance for managing medical conditions that are more common for people who are older than age 68. Falls are a major cause of broken bones and head injuries in people who are older than age 68. Take precautions to prevent a fall at home. Work with your health care provider to learn what changes you can make to improve your health and wellness and to prevent falls. This information is not intended to replace advice given to you by your health care provider. Make sure you discuss any questions you have with your health care provider. Document Revised: 02/02/2021 Document Reviewed: 02/02/2021 Elsevier Patient Education  2023 Elsevier Inc.  

## 2022-11-29 DIAGNOSIS — R3912 Poor urinary stream: Secondary | ICD-10-CM | POA: Diagnosis not present

## 2022-11-29 DIAGNOSIS — N401 Enlarged prostate with lower urinary tract symptoms: Secondary | ICD-10-CM | POA: Diagnosis not present

## 2022-11-29 DIAGNOSIS — R351 Nocturia: Secondary | ICD-10-CM | POA: Diagnosis not present

## 2022-11-29 DIAGNOSIS — N5201 Erectile dysfunction due to arterial insufficiency: Secondary | ICD-10-CM | POA: Diagnosis not present

## 2022-12-13 DIAGNOSIS — D1801 Hemangioma of skin and subcutaneous tissue: Secondary | ICD-10-CM | POA: Diagnosis not present

## 2022-12-13 DIAGNOSIS — L821 Other seborrheic keratosis: Secondary | ICD-10-CM | POA: Diagnosis not present

## 2022-12-13 DIAGNOSIS — L814 Other melanin hyperpigmentation: Secondary | ICD-10-CM | POA: Diagnosis not present

## 2022-12-13 DIAGNOSIS — L578 Other skin changes due to chronic exposure to nonionizing radiation: Secondary | ICD-10-CM | POA: Diagnosis not present

## 2022-12-21 DIAGNOSIS — R3121 Asymptomatic microscopic hematuria: Secondary | ICD-10-CM | POA: Diagnosis not present

## 2022-12-21 DIAGNOSIS — N2889 Other specified disorders of kidney and ureter: Secondary | ICD-10-CM | POA: Diagnosis not present

## 2023-04-18 DIAGNOSIS — N401 Enlarged prostate with lower urinary tract symptoms: Secondary | ICD-10-CM | POA: Diagnosis not present

## 2023-04-29 ENCOUNTER — Other Ambulatory Visit: Payer: Self-pay | Admitting: Urology

## 2023-04-29 DIAGNOSIS — R932 Abnormal findings on diagnostic imaging of liver and biliary tract: Secondary | ICD-10-CM

## 2023-04-29 DIAGNOSIS — D49512 Neoplasm of unspecified behavior of left kidney: Secondary | ICD-10-CM

## 2023-05-02 ENCOUNTER — Ambulatory Visit: Payer: Medicare PPO | Admitting: Emergency Medicine

## 2023-05-02 ENCOUNTER — Encounter: Payer: Self-pay | Admitting: Emergency Medicine

## 2023-05-02 VITALS — BP 116/84 | HR 63 | Temp 98.4°F | Ht 73.25 in | Wt 196.4 lb

## 2023-05-02 DIAGNOSIS — N400 Enlarged prostate without lower urinary tract symptoms: Secondary | ICD-10-CM

## 2023-05-02 DIAGNOSIS — J4520 Mild intermittent asthma, uncomplicated: Secondary | ICD-10-CM | POA: Diagnosis not present

## 2023-05-02 NOTE — Patient Instructions (Signed)
Health Maintenance After Age 68 After age 68, you are at a higher risk for certain long-term diseases and infections as well as injuries from falls. Falls are a major cause of broken bones and head injuries in people who are older than age 68. Getting regular preventive care can help to keep you healthy and well. Preventive care includes getting regular testing and making lifestyle changes as recommended by your health care provider. Talk with your health care provider about: Which screenings and tests you should have. A screening is a test that checks for a disease when you have no symptoms. A diet and exercise plan that is right for you. What should I know about screenings and tests to prevent falls? Screening and testing are the best ways to find a health problem early. Early diagnosis and treatment give you the best chance of managing medical conditions that are common after age 68. Certain conditions and lifestyle choices may make you more likely to have a fall. Your health care provider may recommend: Regular vision checks. Poor vision and conditions such as cataracts can make you more likely to have a fall. If you wear glasses, make sure to get your prescription updated if your vision changes. Medicine review. Work with your health care provider to regularly review all of the medicines you are taking, including over-the-counter medicines. Ask your health care provider about any side effects that may make you more likely to have a fall. Tell your health care provider if any medicines that you take make you feel dizzy or sleepy. Strength and balance checks. Your health care provider may recommend certain tests to check your strength and balance while standing, walking, or changing positions. Foot health exam. Foot pain and numbness, as well as not wearing proper footwear, can make you more likely to have a fall. Screenings, including: Osteoporosis screening. Osteoporosis is a condition that causes  the bones to get weaker and break more easily. Blood pressure screening. Blood pressure changes and medicines to control blood pressure can make you feel dizzy. Depression screening. You may be more likely to have a fall if you have a fear of falling, feel depressed, or feel unable to do activities that you used to do. Alcohol use screening. Using too much alcohol can affect your balance and may make you more likely to have a fall. Follow these instructions at home: Lifestyle Do not drink alcohol if: Your health care provider tells you not to drink. If you drink alcohol: Limit how much you have to: 0-1 drink a day for women. 0-2 drinks a day for men. Know how much alcohol is in your drink. In the [place] one drink equals one 12 oz bottle of beer (355 mL), one 5 oz glass of wine (148 mL), or one 1 oz glass of hard liquor (44 mL). Do not use any products that contain nicotine or tobacco. These products include cigarettes, chewing tobacco, and vaping devices, such as e-cigarettes. If you need help quitting, ask your health care provider. Activity  Follow a regular exercise program to stay fit. This will help you maintain your balance. Ask your health care provider what types of exercise are appropriate for you. If you need a cane or walker, use it as recommended by your health care provider. Wear supportive shoes that have nonskid soles. Safety  Remove any tripping hazards, such as rugs, cords, and clutter. Install safety equipment such as grab bars in bathrooms and safety rails on stairs. Keep rooms and walkways   well-lit. General instructions Talk with your health care provider about your risks for falling. Tell your health care provider if: You fall. Be sure to tell your health care provider about all falls, even ones that seem minor. You feel dizzy, tiredness (fatigue), or off-balance. Take over-the-counter and prescription medicines only as told by your health care provider. These include  supplements. Eat a healthy diet and maintain a healthy weight. A healthy diet includes low-fat dairy products, low-fat (lean) meats, and fiber from whole grains, beans, and lots of fruits and vegetables. Stay current with your vaccines. Schedule regular health, dental, and eye exams. Summary Having a healthy lifestyle and getting preventive care can help to protect your health and wellness after age 68. Screening and testing are the best way to find a health problem early and help you avoid having a fall. Early diagnosis and treatment give you the best chance for managing medical conditions that are more common for people who are older than age 68. Falls are a major cause of broken bones and head injuries in people who are older than age 68. Take precautions to prevent a fall at home. Work with your health care provider to learn what changes you can make to improve your health and wellness and to prevent falls. This information is not intended to replace advice given to you by your health care provider. Make sure you discuss any questions you have with your health care provider. Document Revised: 02/02/2021 Document Reviewed: 02/02/2021 Elsevier Patient Education  2024 Elsevier Inc.  

## 2023-05-02 NOTE — Assessment & Plan Note (Signed)
Well-controlled.  On no medications.

## 2023-05-02 NOTE — Assessment & Plan Note (Signed)
Symptoms well-controlled Continues alfuzosin 10 mg daily and finasteride 5 mg Occasionally takes tadalafil 20 mg Normal PSAs

## 2023-05-02 NOTE — Progress Notes (Signed)
Taylor Morton 67 y.o.   Chief Complaint  Patient presents with   Medical Management of Chronic Issues    f/u appt, no concerns     HISTORY OF PRESENT ILLNESS: This is a 67 y.o. male here for 82-month follow-up of chronic medical issues. Overall doing well.  Staying physically active hiking and exercising Eating well. Has no complaints or any other medical concerns  HPI   Prior to Admission medications   Medication Sig Start Date End Date Taking? Authorizing Provider  alfuzosin (UROXATRAL) 10 MG 24 hr tablet Take 10 mg by mouth daily. 08/21/22  Yes [provider]  finasteride (PROSCAR) 5 MG tablet Take 5 mg by mouth daily. 12/30/21  Yes [provider]  tadalafil (CIALIS) 20 MG tablet Take 0.5 tablets (10 mg total) by mouth daily as needed for erectile dysfunction. 09/17/22  Yes Georgina Quint, MD    Allergies  Allergen Reactions   Dust Mite Extract     cough    Patient Active Problem List   Diagnosis Date Noted   History of BPH 04/28/2022   Coronary artery calcification 02/28/2017   Asthma     Past Medical History:  Diagnosis Date   Asthma    caused by dust - no inhaler   Bradycardia    Chest pain    no current problems   COPD (chronic obstructive pulmonary disease) (HCC)     Past Surgical History:  Procedure Laterality Date   APPENDECTOMY      Social History   Socioeconomic History   Marital status: Married    Spouse name: Annabelle Harman   Number of children: 2   Years of education: 16   Highest education level: Not on file  Occupational History   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[employer]    Comment: Earth Engineer, water Issues  Tobacco Use   Smoking status: Former    Current packs/day: 0.00    Average packs/day: 1 pack/day for 10.0 years (10.0 ttl pk-yrs)    Types: Cigarettes    Start date: 82    Quit date: 1992    Years since quitting: 32.6   Smokeless tobacco: Never  Vaping Use   Vaping  status: Never Used  Substance and Sexual Activity   Alcohol use: Yes    Alcohol/week: 7.0 - 14.0 standard drinks of alcohol    Types: 7 - 14 Standard drinks or equivalent per week    Comment: 1-2 day beer / liquor   Drug use: No   Sexual activity: Yes  Other Topics Concern   Not on file  Social History Narrative   Lives with his wife.  Sons are both in college.   Social Determinants of Health   Financial Resource Strain: Low Risk  (09/17/2022)   Overall Financial Resource Strain (CARDIA)    Difficulty of Paying Living Expenses: Not hard at all  Food Insecurity: No Food Insecurity (09/17/2022)   Hunger Vital Sign    Worried About Running Out of Food in the Last Year: Never true    Ran Out of Food in the Last Year: Never true  Transportation Needs: No Transportation Needs (09/17/2022)   PRAPARE - Administrator, Civil Service (Medical): No    Lack of Transportation (Non-Medical): No  Physical Activity: Sufficiently Active (09/17/2022)   Exercise Vital Sign    Days of Exercise per Week: 7 days    Minutes of Exercise per Session: 30 min  Stress: No Stress Concern Present (09/17/2022)   [employer] of Occupational Health - Occupational Stress Questionnaire    Feeling of Stress : Not at all  Social Connections: Socially Integrated (09/17/2022)   Social Connection and Isolation Panel [NHANES]    Frequency of Communication with Friends and Family: More than three times a week    Frequency of Social Gatherings with Friends and Family: More than three times a week    Attends Religious Services: More than 4 times per year    Active Member of [employer] or Organizations: Yes    Attends Engineer, structural: More than 4 times per year    Marital Status: Married  Catering manager Violence: Not At Risk (09/17/2022)   Humiliation, Afraid, Rape, and Kick questionnaire    Fear of Current or Ex-Partner: No    Emotionally Abused: No    Physically Abused: No    Sexually  Abused: No    Family History  Problem Relation Age of Onset   Cancer Father    Arthritis Mother    Colon cancer Neg Hx    Rectal cancer Neg Hx    Stomach cancer Neg Hx      Review of Systems  Constitutional: Negative.  Negative for chills and fever.  HENT: Negative.  Negative for congestion and sore throat.   Respiratory: Negative.  Negative for cough and shortness of breath.   Cardiovascular: Negative.  Negative for chest pain and palpitations.  Gastrointestinal:  Negative for abdominal pain, diarrhea, nausea and vomiting.  Genitourinary: Negative.  Negative for dysuria and hematuria.  Skin: Negative.  Negative for rash.  Neurological: Negative.  Negative for dizziness and headaches.  All other systems reviewed and are negative.   Vitals:   05/02/23 1020  BP: 116/84  Pulse: 63  Temp: 98.4 F (36.9 C)  SpO2: 98%    Physical Exam Vitals reviewed.  Constitutional:      Appearance: Normal appearance.  HENT:     Head: Normocephalic.  Eyes:     Extraocular Movements: Extraocular movements intact.  Cardiovascular:     Rate and Rhythm: Normal rate and regular rhythm.     Pulses: Normal pulses.     Heart sounds: Normal heart sounds.  Pulmonary:     Effort: Pulmonary effort is normal.     Breath sounds: Normal breath sounds.  Musculoskeletal:     Cervical back: No tenderness.  Lymphadenopathy:     Cervical: No cervical adenopathy.  Skin:    General: Skin is warm and dry.     Capillary Refill: Capillary refill takes less than 2 seconds.  Neurological:     General: No focal deficit present.     Mental Status: He is alert and oriented to person, place, and time.  Psychiatric:        Mood and Affect: Mood normal.        Behavior: Behavior normal.      ASSESSMENT & PLAN: A total of 30 minutes was spent with the patient and counseling/coordination of care regarding preparing for this visit, review of most recent office visit notes, review of most recent blood work  results, review of chronic medical conditions, education on nutrition, review of health maintenance items, prognosis, documentation, and need for follow-up  Problem List Items Addressed This Visit       Respiratory   Asthma    Well-controlled.  On no medications.        Genitourinary   Benign prostatic hyperplasia without lower urinary  tract symptoms - Primary    Symptoms well-controlled Continues alfuzosin 10 mg daily and finasteride 5 mg Occasionally takes tadalafil 20 mg Normal PSAs      Patient Instructions  Health Maintenance After Age 71 After age 90, you are at a higher risk for certain long-term diseases and infections as well as injuries from falls. Falls are a major cause of broken bones and head injuries in people who are older than age 77. Getting regular preventive care can help to keep you healthy and well. Preventive care includes getting regular testing and making lifestyle changes as recommended by your health care provider. Talk with your health care provider about: Which screenings and tests you should have. A screening is a test that checks for a disease when you have no symptoms. A diet and exercise plan that is right for you. What should I know about screenings and tests to prevent falls? Screening and testing are the best ways to find a health problem early. Early diagnosis and treatment give you the best chance of managing medical conditions that are common after age 45. Certain conditions and lifestyle choices may make you more likely to have a fall. Your health care provider may recommend: Regular vision checks. Poor vision and conditions such as cataracts can make you more likely to have a fall. If you wear glasses, make sure to get your prescription updated if your vision changes. Medicine review. Work with your health care provider to regularly review all of the medicines you are taking, including over-the-counter medicines. Ask your health care provider about  any side effects that may make you more likely to have a fall. Tell your health care provider if any medicines that you take make you feel dizzy or sleepy. Strength and balance checks. Your health care provider may recommend certain tests to check your strength and balance while standing, walking, or changing positions. Foot health exam. Foot pain and numbness, as well as not wearing proper footwear, can make you more likely to have a fall. Screenings, including: Osteoporosis screening. Osteoporosis is a condition that causes the bones to get weaker and break more easily. Blood pressure screening. Blood pressure changes and medicines to control blood pressure can make you feel dizzy. Depression screening. You may be more likely to have a fall if you have a fear of falling, feel depressed, or feel unable to do activities that you used to do. Alcohol use screening. Using too much alcohol can affect your balance and may make you more likely to have a fall. Follow these instructions at home: Lifestyle Do not drink alcohol if: Your health care provider tells you not to drink. If you drink alcohol: Limit how much you have to: 0-1 drink a day for women. 0-2 drinks a day for men. Know how much alcohol is in your drink. In the [place] one drink equals one 12 oz bottle of beer (355 mL), one 5 oz glass of wine (148 mL), or one 1 oz glass of hard liquor (44 mL). Do not use any products that contain nicotine or tobacco. These products include cigarettes, chewing tobacco, and vaping devices, such as e-cigarettes. If you need help quitting, ask your health care provider. Activity  Follow a regular exercise program to stay fit. This will help you maintain your balance. Ask your health care provider what types of exercise are appropriate for you. If you need a cane or walker, use it as recommended by your health care provider. Wear supportive shoes that  have nonskid soles. Safety  Remove any tripping hazards,  such as rugs, cords, and clutter. Install safety equipment such as grab bars in bathrooms and safety rails on stairs. Keep rooms and walkways well-lit. General instructions Talk with your health care provider about your risks for falling. Tell your health care provider if: You fall. Be sure to tell your health care provider about all falls, even ones that seem minor. You feel dizzy, tiredness (fatigue), or off-balance. Take over-the-counter and prescription medicines only as told by your health care provider. These include supplements. Eat a healthy diet and maintain a healthy weight. A healthy diet includes low-fat dairy products, low-fat (lean) meats, and fiber from whole grains, beans, and lots of fruits and vegetables. Stay current with your vaccines. Schedule regular health, dental, and eye exams. Summary Having a healthy lifestyle and getting preventive care can help to protect your health and wellness after age 13. Screening and testing are the best way to find a health problem early and help you avoid having a fall. Early diagnosis and treatment give you the best chance for managing medical conditions that are more common for people who are older than age 67. Falls are a major cause of broken bones and head injuries in people who are older than age 5. Take precautions to prevent a fall at home. Work with your health care provider to learn what changes you can make to improve your health and wellness and to prevent falls. This information is not intended to replace advice given to you by your health care provider. Make sure you discuss any questions you have with your health care provider. Document Revised: 02/02/2021 Document Reviewed: 02/02/2021 Elsevier Patient Education  2024 Elsevier Inc.    Edwina Barth, MD [facility]

## 2023-06-13 DIAGNOSIS — N401 Enlarged prostate with lower urinary tract symptoms: Secondary | ICD-10-CM | POA: Diagnosis not present

## 2023-06-14 ENCOUNTER — Ambulatory Visit
Admission: RE | Admit: 2023-06-14 | Discharge: 2023-06-14 | Disposition: A | Discharge status: Home / Self Care | Payer: Medicare PPO | Source: Ambulatory Visit | Attending: Urology | Admitting: Urology

## 2023-06-14 ENCOUNTER — Other Ambulatory Visit: Payer: Medicare PPO

## 2023-06-14 DIAGNOSIS — R932 Abnormal findings on diagnostic imaging of liver and biliary tract: Secondary | ICD-10-CM

## 2023-06-14 DIAGNOSIS — D1803 Hemangioma of intra-abdominal structures: Secondary | ICD-10-CM | POA: Diagnosis not present

## 2023-06-14 DIAGNOSIS — D49512 Neoplasm of unspecified behavior of left kidney: Secondary | ICD-10-CM

## 2023-06-14 DIAGNOSIS — N2889 Other specified disorders of kidney and ureter: Secondary | ICD-10-CM | POA: Diagnosis not present

## 2023-06-14 MED ORDER — GADOPICLENOL 0.5 MMOL/ML IV SOLN
9.0000 mL | Freq: Once | INTRAVENOUS | Status: AC | PRN
  Administered 2023-06-14: 9 mL via INTRAVENOUS

## 2023-06-20 DIAGNOSIS — D49512 Neoplasm of unspecified behavior of left kidney: Secondary | ICD-10-CM | POA: Diagnosis not present

## 2023-06-20 DIAGNOSIS — R338 Other retention of urine: Secondary | ICD-10-CM | POA: Diagnosis not present

## 2023-06-20 DIAGNOSIS — H5203 Hypermetropia, bilateral: Secondary | ICD-10-CM | POA: Diagnosis not present

## 2023-06-20 DIAGNOSIS — N401 Enlarged prostate with lower urinary tract symptoms: Secondary | ICD-10-CM | POA: Diagnosis not present

## 2023-06-20 DIAGNOSIS — H2513 Age-related nuclear cataract, bilateral: Secondary | ICD-10-CM | POA: Diagnosis not present

## 2023-07-08 DIAGNOSIS — D49512 Neoplasm of unspecified behavior of left kidney: Secondary | ICD-10-CM | POA: Diagnosis not present

## 2023-07-08 DIAGNOSIS — N5201 Erectile dysfunction due to arterial insufficiency: Secondary | ICD-10-CM | POA: Diagnosis not present

## 2023-09-16 ENCOUNTER — Ambulatory Visit (INDEPENDENT_AMBULATORY_CARE_PROVIDER_SITE_OTHER): Payer: Medicare PPO

## 2023-09-16 DIAGNOSIS — Z0000 Encounter for general adult medical examination without abnormal findings: Secondary | ICD-10-CM | POA: Diagnosis not present

## 2023-09-16 NOTE — Progress Notes (Signed)
Subjective:   Taylor Morton is a 67 y.o. male who presents for Medicare Annual/Subsequent preventive examination.  Visit Complete: Virtual I connected with  Dorothey Baseman on 09/16/23 by a audio enabled telemedicine application and verified that I am speaking with the correct person using two identifiers.  Patient Location: Home  Provider Location: Office/Clinic  I discussed the limitations of evaluation and management by telemedicine. The patient expressed understanding and agreed to proceed.  Vital Signs: Because this visit was a virtual/telehealth visit, some criteria may be missing or patient reported. Any vitals not documented were not able to be obtained and vitals that have been documented are patient reported.    Cardiac Risk Factors include: advanced age (>57men, >56 women);male gender     Objective:    Today's Vitals   There is no height or weight on file to calculate BMI.     09/16/2023    3:11 PM 09/17/2022    8:51 AM 02/25/2022    1:04 AM  Advanced Directives  Does Patient Have a Medical Advance Directive? No No No  Would patient like information on creating a medical advance directive?  No - Patient declined No - Patient declined    Current Medications (verified) Outpatient Encounter Medications as of 09/16/2023  Medication Sig   alfuzosin (UROXATRAL) 10 MG 24 hr tablet Take 10 mg by mouth daily.   finasteride (PROSCAR) 5 MG tablet Take 5 mg by mouth daily.   tadalafil (CIALIS) 20 MG tablet Take 0.5 tablets (10 mg total) by mouth daily as needed for erectile dysfunction.   No facility-administered encounter medications on file as of 09/16/2023.    Allergies (verified) Dust mite extract   History: Past Medical History:  Diagnosis Date   Asthma    caused by dust - no inhaler   Bradycardia    Chest pain    no current problems   COPD (chronic obstructive pulmonary disease) (HCC)    Past Surgical History:  Procedure Laterality Date   APPENDECTOMY      Family History  Problem Relation Age of Onset   Cancer Father    Arthritis Mother    Colon cancer Neg Hx    Rectal cancer Neg Hx    Stomach cancer Neg Hx    Social History   Socioeconomic History   Marital status: Married    Spouse name: Annabelle Harman   Number of children: 2   Years of education: 16   Highest education level: Not on file  Occupational History   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[employer]    Comment: Earth Engineer, water Issues  Tobacco Use   Smoking status: Former    Current packs/day: 0.00    Average packs/day: 1 pack/day for 10.0 years (10.0 ttl pk-yrs)    Types: Cigarettes    Start date: 6    Quit date: 1992    Years since quitting: 32.9   Smokeless tobacco: Never  Vaping Use   Vaping status: Never Used  Substance and Sexual Activity   Alcohol use: Yes    Alcohol/week: 7.0 - 14.0 standard drinks of alcohol    Types: 7 - 14 Standard drinks or equivalent per week    Comment: 1-2 day beer / liquor   Drug use: No   Sexual activity: Yes  Other Topics Concern   Not on file  Social History Narrative   Lives with his wife.  Sons are both in college.   Social Drivers of  Health   Financial Resource Strain: Low Risk  (09/16/2023)   Overall Financial Resource Strain (CARDIA)    Difficulty of Paying Living Expenses: Not hard at all  Food Insecurity: No Food Insecurity (09/16/2023)   Hunger Vital Sign    Worried About Running Out of Food in the Last Year: Never true    Ran Out of Food in the Last Year: Never true  Transportation Needs: No Transportation Needs (09/16/2023)   PRAPARE - Administrator, Civil Service (Medical): No    Lack of Transportation (Non-Medical): No  Physical Activity: Sufficiently Active (09/16/2023)   Exercise Vital Sign    Days of Exercise per Week: 7 days    Minutes of Exercise per Session: 60 min  Stress: No Stress Concern Present (09/16/2023)   [employer] of Occupational Health  - Occupational Stress Questionnaire    Feeling of Stress : Not at all  Social Connections: Moderately Isolated (09/16/2023)   Social Connection and Isolation Panel [NHANES]    Frequency of Communication with Friends and Family: More than three times a week    Frequency of Social Gatherings with Friends and Family: Once a week    Attends Religious Services: Never    Database administrator or Organizations: No    Attends Engineer, structural: Never    Marital Status: Married    Tobacco Counseling Counseling given: Not Answered   Clinical Intake:  Pre-visit preparation completed: Yes  Pain : No/denies pain     Nutritional Risks: None Diabetes: No  How often do you need to have someone help you when you read instructions, pamphlets, or other written materials from your doctor or pharmacy?: 1 - Never  Interpreter Needed?: No  Information entered by :: NAllen LPN   Activities of Daily Living    09/16/2023    3:07 PM 09/17/2022    8:54 AM  In your present state of health, do you have any difficulty performing the following activities:  Hearing? 0 0  Vision? 0 0  Difficulty concentrating or making decisions? 0 0  Walking or climbing stairs? 0 0  Dressing or bathing? 0 0  Doing errands, shopping? 0 0  Preparing Food and eating ? N N  Using the Toilet? N N  In the past six months, have you accidently leaked urine? N N  Do you have problems with loss of bowel control? N N  Managing your Medications? N N  Managing your Finances? N N  Housekeeping or managing your Housekeeping? N N    Patient Care Team: Georgina Quint, MD as PCP - General (Internal Medicine)  Indicate any recent Medical Services you may have received from other than [facility] providers in the past year (date may be approximate).     Assessment:   This is a routine wellness examination for Taylor Morton.  Hearing/Vision screen Hearing Screening - Comments:: Denies hearing issues Vision Screening  - Comments:: Regular eye exams, [facility]   Goals Addressed             This Visit's Progress    Patient Stated       09/16/2023, wants to get back in shape       Depression Screen    09/16/2023    3:13 PM 05/02/2023   10:21 AM 11/01/2022   10:19 AM 09/17/2022    8:54 AM 04/28/2022   11:27 AM 01/28/2021   11:18 AM 03/11/2020   10:07 AM  PHQ 2/9 Scores  PHQ -  2 Score 0 0 0 0 0 3 0  PHQ- 9 Score      9     Fall Risk    09/16/2023    3:12 PM 05/02/2023   10:21 AM 11/01/2022   10:19 AM 09/17/2022    8:52 AM 04/28/2022   11:27 AM  Fall Risk   Falls in the past year? 0 0 0 0 0  Number falls in past yr: 0 0 0 0   Injury with Fall? 0 0 0 0   Risk for fall due to : Medication side effect No Fall Risks No Fall Risks No Fall Risks   Follow up Falls prevention discussed;Falls evaluation completed Falls evaluation completed Falls evaluation completed Falls prevention discussed     MEDICARE RISK AT HOME: Medicare Risk at Home Any stairs in or around the home?: Yes If so, are there any without handrails?: No Home free of loose throw rugs in walkways, pet beds, electrical cords, etc?: Yes Adequate lighting in your home to reduce risk of falls?: Yes Life alert?: No Use of a cane, walker or w/c?: No Grab bars in the bathroom?: No Shower chair or bench in shower?: No Elevated toilet seat or a handicapped toilet?: No  TIMED UP AND GO:  Was the test performed?  No    Cognitive Function:        09/16/2023    3:13 PM 09/17/2022    9:04 AM  6CIT Screen  What Year? 0 points 0 points  What month? 0 points 0 points  What time? 0 points 0 points  Count back from 20 0 points 0 points  Months in reverse 0 points 0 points  Repeat phrase 0 points 0 points  Total Score 0 points 0 points    Immunizations Immunization History  Administered Date(s) Administered   PFIZER(Purple Top)SARS-COV-2 Vaccination 11/24/2019, 12/26/2019, 08/28/2020, 12/13/2021   Unspecified SARS-COV-2  Vaccination 04/30/2023    TDAP status: Due, Education has been provided regarding the importance of this vaccine. Advised may receive this vaccine at local pharmacy or Health Dept. Aware to provide a copy of the vaccination record if obtained from local pharmacy or Health Dept. Verbalized acceptance and understanding.  Flu Vaccine status: Declined, Education has been provided regarding the importance of this vaccine but patient still declined. Advised may receive this vaccine at local pharmacy or Health Dept. Aware to provide a copy of the vaccination record if obtained from local pharmacy or Health Dept. Verbalized acceptance and understanding.  Pneumococcal vaccine status: Declined,  Education has been provided regarding the importance of this vaccine but patient still declined. Advised may receive this vaccine at local pharmacy or Health Dept. Aware to provide a copy of the vaccination record if obtained from local pharmacy or Health Dept. Verbalized acceptance and understanding.   Covid-19 vaccine status: Information provided on how to obtain vaccines.   Qualifies for Shingles Vaccine? Yes   Zostavax completed No   Shingrix Completed?: No.    Education has been provided regarding the importance of this vaccine. Patient has been advised to call insurance company to determine out of pocket expense if they have not yet received this vaccine. Advised may also receive vaccine at local pharmacy or Health Dept. Verbalized acceptance and understanding.  Screening Tests Health Maintenance  Topic Date Due   COVID-19 Vaccine (6 - 2024-25 season) 06/25/2023   INFLUENZA VACCINE  12/26/2023 (Originally 04/28/2023)   Pneumonia Vaccine 36+ Years old (1 of 2 - PCV) 12/31/2023 (Originally [birth year]24/1961)  Zoster Vaccines- Shingrix (1 of 2) 04/29/2024 (Originally 04/24/1974)   Medicare Annual Wellness (AWV)  09/15/2024   Colonoscopy  03/03/2030   Hepatitis C Screening  Completed   HPV VACCINES  Aged Out    DTaP/Tdap/Td  Discontinued    Health Maintenance  Health Maintenance Due  Topic Date Due   COVID-19 Vaccine (6 - 2024-25 season) 06/25/2023    Colorectal cancer screening: Type of screening: Colonoscopy. Completed 03/03/2020. Repeat every 10 years  Lung Cancer Screening: (Low Dose CT Chest recommended if Age 31-80 years, 20 pack-year currently smoking OR have quit w/in 15years.) does not qualify.   Lung Cancer Screening Referral: no  Additional Screening:  Hepatitis C Screening: does qualify; Completed 10/04/2018  Vision Screening: Recommended annual ophthalmology exams for early detection of glaucoma and other disorders of the eye. Is the patient up to date with their annual eye exam?  Yes  Who is the provider or what is the name of the office in which the patient attends annual eye exams? [facility] If pt is not established with a provider, would they like to be referred to a provider to establish care? No .   Dental Screening: Recommended annual dental exams for proper oral hygiene  Diabetic Foot Exam: n/a  Community Resource Referral / Chronic Care Management: CRR required this visit?  No   CCM required this visit?  No     Plan:     I have personally reviewed and noted the following in the patient's chart:   Medical and social history Use of alcohol, tobacco or illicit drugs  Current medications and supplements including opioid prescriptions. Patient is not currently taking opioid prescriptions. Functional ability and status Nutritional status Physical activity Advanced directives List of other physicians Hospitalizations, surgeries, and ER visits in previous 12 months Vitals Screenings to include cognitive, depression, and falls Referrals and appointments  In addition, I have reviewed and discussed with patient certain preventive protocols, quality metrics, and best practice recommendations. A written personalized care plan for preventive services as well  as general preventive health recommendations were provided to patient.     CACE ORLOV, LPN   16/06/9603   After Visit Summary: (MyChart) Due to this being a telephonic visit, the after visit summary with patients personalized plan was offered to patient via MyChart   Nurse Notes: none

## 2023-09-16 NOTE — Patient Instructions (Signed)
Taylor Morton , Thank you for taking time to come for your Medicare Wellness Visit. I appreciate your ongoing commitment to your health goals. Please review the following plan we discussed and let me know if I can assist you in the future.   Referrals/Orders/Follow-Ups/Clinician Recommendations: none  This is a list of the screening recommended for you and due dates:  Health Maintenance  Topic Date Due   COVID-19 Vaccine (5 - 2024-25 season) 05/29/2023   Flu Shot  12/26/2023*   Pneumonia Vaccine (1 of 2 - PCV) 12/31/2023*   Zoster (Shingles) Vaccine (1 of 2) 04/29/2024*   Medicare Annual Wellness Visit  09/15/2024   Colon Cancer Screening  03/03/2030   Hepatitis C Screening  Completed   HPV Vaccine  Aged Out   DTaP/Tdap/Td vaccine  Discontinued  *Topic was postponed. The date shown is not the original due date.    Advanced directives: (Copy Requested) Please bring a copy of your health care power of attorney and living will to the office to be added to your chart at your convenience.  Next Medicare Annual Wellness Visit scheduled for next year: Yes  insert Preventive Care attachment Insert FALL PREVENTION attachment if needed

## 2023-10-03 DIAGNOSIS — N486 Induration penis plastica: Secondary | ICD-10-CM | POA: Diagnosis not present

## 2023-10-25 ENCOUNTER — Ambulatory Visit: Payer: Medicare PPO | Admitting: Emergency Medicine

## 2023-10-25 VITALS — BP 134/72 | HR 56 | Temp 97.9°F | Ht 73.25 in | Wt 184.0 lb

## 2023-10-25 DIAGNOSIS — R051 Acute cough: Secondary | ICD-10-CM | POA: Diagnosis not present

## 2023-10-25 DIAGNOSIS — J4520 Mild intermittent asthma, uncomplicated: Secondary | ICD-10-CM

## 2023-10-25 DIAGNOSIS — J22 Unspecified acute lower respiratory infection: Secondary | ICD-10-CM | POA: Diagnosis not present

## 2023-10-25 DIAGNOSIS — R062 Wheezing: Secondary | ICD-10-CM | POA: Insufficient documentation

## 2023-10-25 MED ORDER — AIRSUPRA 90-80 MCG/ACT IN AERO: 2.0000 | INHALATION_SPRAY | Freq: Two times a day (BID) | RESPIRATORY_TRACT | 1 refills | Status: DC

## 2023-10-25 MED ORDER — AZITHROMYCIN 250 MG PO TABS: ORAL_TABLET | ORAL | 0 refills | Status: DC

## 2023-10-25 NOTE — Progress Notes (Signed)
Taylor Morton 68 y.o.   Chief Complaint  Patient presents with   Cough    Patient states he's been coughing since Wednesday last week when he was in [place].Green and dark mucus, no fever, no sore throat.     HISTORY OF PRESENT ILLNESS: This is a 68 y.o. male complaining of productive cough and chest congestion that started last week Recently traveled to [facility].  Symptoms started shortly after trip. No other complaints or medical concerns today   Cough Associated symptoms include wheezing. Pertinent negatives include no chest pain, chills, fever, headaches, hemoptysis, rash or shortness of breath.     Prior to Admission medications   Medication Sig Start Date End Date Taking? Authorizing Provider  alfuzosin (UROXATRAL) 10 MG 24 hr tablet Take 10 mg by mouth daily. 08/21/22  Yes [provider]  finasteride (PROSCAR) 5 MG tablet Take 5 mg by mouth daily. 12/30/21  Yes [provider]  [facility] syringe Inject 0.5 mLs into the muscle once. 04/30/23  Yes [provider]  tadalafil (CIALIS) 20 MG tablet Take 0.5 tablets (10 mg total) by mouth daily as needed for erectile dysfunction. 09/17/22  Yes Georgina Quint, MD    Allergies  Allergen Reactions   Dust Mite Extract     cough    Patient Active Problem List   Diagnosis Date Noted   Benign prostatic hyperplasia without lower urinary tract symptoms 05/02/2023   History of BPH 04/28/2022   Coronary artery calcification 02/28/2017   Asthma     Past Medical History:  Diagnosis Date   Asthma    caused by dust - no inhaler   Bradycardia    Chest pain    no current problems   COPD (chronic obstructive pulmonary disease) (HCC)     Past Surgical History:  Procedure Laterality Date   APPENDECTOMY      Social History   Socioeconomic History   Marital status: Married    Spouse name: Annabelle Harman   Number of children: 2   Years of education: 16   Highest education level: Bachelor's degree  (e.g., BA, AB, BS)  Occupational History   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[employer]    Comment: Earth Engineer, water Issues  Tobacco Use   Smoking status: Former    Current packs/day: 0.00    Average packs/day: 1 pack/day for 10.0 years (10.0 ttl pk-yrs)    Types: Cigarettes    Start date: 57    Quit date: 1992    Years since quitting: 33.0   Smokeless tobacco: Never  Vaping Use   Vaping status: Never Used  Substance and Sexual Activity   Alcohol use: Yes    Alcohol/week: 7.0 - 14.0 standard drinks of alcohol    Types: 7 - 14 Standard drinks or equivalent per week    Comment: 1-2 day beer / liquor   Drug use: No   Sexual activity: Yes  Other Topics Concern   Not on file  Social History Narrative   Lives with his wife.  Sons are both in college.   Social Drivers of Corporate investment banker Strain: Low Risk  (10/25/2023)   Overall Financial Resource Strain (CARDIA)    Difficulty of Paying Living Expenses: Not hard at all  Food Insecurity: No Food Insecurity (10/25/2023)   Hunger Vital Sign    Worried About Running Out of Food in the Last Year: Never true    Ran Out of Food  in the Last Year: Never true  Transportation Needs: No Transportation Needs (10/25/2023)   PRAPARE - Administrator, Civil Service (Medical): No    Lack of Transportation (Non-Medical): No  Physical Activity: Sufficiently Active (10/25/2023)   Exercise Vital Sign    Days of Exercise per Week: 6 days    Minutes of Exercise per Session: 100 min  Stress: No Stress Concern Present (10/25/2023)   [employer] of Occupational Health - Occupational Stress Questionnaire    Feeling of Stress : Only a little  Social Connections: Moderately Integrated (10/25/2023)   Social Connection and Isolation Panel [NHANES]    Frequency of Communication with Friends and Family: Twice a week    Frequency of Social Gatherings with Friends and Family: Once a week    Attends  Religious Services: Never    Database administrator or Organizations: Yes    Attends Engineer, structural: More than 4 times per year    Marital Status: Married  Recent Concern: Social Connections - Moderately Isolated (09/16/2023)   Social Connection and Isolation Panel [NHANES]    Frequency of Communication with Friends and Family: More than three times a week    Frequency of Social Gatherings with Friends and Family: Once a week    Attends Religious Services: Never    Database administrator or Organizations: No    Attends Banker Meetings: Never    Marital Status: Married  Catering manager Violence: Not At Risk (09/16/2023)   Humiliation, Afraid, Rape, and Kick questionnaire    Fear of Current or Ex-Partner: No    Emotionally Abused: No    Physically Abused: No    Sexually Abused: No    Family History  Problem Relation Age of Onset   Cancer Father    Arthritis Mother    Colon cancer Neg Hx    Rectal cancer Neg Hx    Stomach cancer Neg Hx      Review of Systems  Constitutional: Negative.  Negative for chills and fever.  HENT:  Positive for congestion.   Respiratory:  Positive for cough, sputum production and wheezing. Negative for hemoptysis and shortness of breath.   Cardiovascular: Negative.  Negative for chest pain and palpitations.  Gastrointestinal:  Negative for abdominal pain, diarrhea, nausea and vomiting.  Genitourinary: Negative.  Negative for dysuria and hematuria.  Skin: Negative.  Negative for rash.  Neurological: Negative.  Negative for dizziness and headaches.  All other systems reviewed and are negative.   Vitals:   10/25/23 0856  BP: 134/72  Pulse: (!) 56  Temp: 97.9 F (36.6 C)  SpO2: 97%    Physical Exam Vitals reviewed.  Constitutional:      Appearance: Normal appearance.  HENT:     Head: Normocephalic.     Mouth/Throat:     Mouth: Mucous membranes are moist.     Pharynx: Oropharynx is clear.  Eyes:      Extraocular Movements: Extraocular movements intact.     Conjunctiva/sclera: Conjunctivae normal.     Pupils: Pupils are equal, round, and reactive to light.  Cardiovascular:     Rate and Rhythm: Normal rate and regular rhythm.     Pulses: Normal pulses.     Heart sounds: Normal heart sounds.  Pulmonary:     Effort: Pulmonary effort is normal.     Breath sounds: Wheezing present. No rales.  Musculoskeletal:     Cervical back: No tenderness.  Lymphadenopathy:  Cervical: No cervical adenopathy.  Skin:    General: Skin is warm and dry.  Neurological:     Mental Status: He is alert and oriented to person, place, and time.  Psychiatric:        Mood and Affect: Mood normal.        Behavior: Behavior normal.      ASSESSMENT & PLAN: Problem List Items Addressed This Visit       Respiratory   Asthma   With mild acute exacerbation Recommend to start Airsupra twice a day for the next 7 days      Relevant Medications   Albuterol-Budesonide (AIRSUPRA) 90-80 MCG/ACT AERO   Lower respiratory infection - Primary   Clinically stable.  No red flag signs or symptoms.  No signs of pneumonia Recommend daily azithromycin for 5 days ED precautions given Advised to rest and stay well-hydrated Symptom management discussed      Relevant Medications   SPIKEVAX syringe   Albuterol-Budesonide (AIRSUPRA) 90-80 MCG/ACT AERO   azithromycin (ZITHROMAX) 250 MG tablet     Other   Acute cough   Cough management discussed Recommend over-the-counter Mucinex DM and cough drops Advised to rest and stay well-hydrated May use NyQuil at bedtime      Relevant Medications   Albuterol-Budesonide (AIRSUPRA) 90-80 MCG/ACT AERO   Wheezing   As a result of bronchospasm and inflammation in the airways Recommend to start Airsupra twice a day for 7 days Advised to rest and stay well-hydrated Antibiotic as prescribed      Relevant Medications   Albuterol-Budesonide (AIRSUPRA) 90-80 MCG/ACT AERO    Patient Instructions  Acute Bronchitis, Adult  Acute bronchitis is when air tubes in the lungs (bronchi) suddenly get swollen. The condition can make it hard for you to breathe. In adults, acute bronchitis usually goes away within 2 weeks. A cough caused by bronchitis may last up to 3 weeks. Smoking, allergies, and asthma can make the condition worse. What are the causes? Germs that cause cold and flu (viruses). The most common cause of this condition is the virus that causes the common cold. Bacteria. Substances that bother (irritate) the lungs, including: Smoke from cigarettes and other types of tobacco. Dust and pollen. Fumes from chemicals, gases, or burned fuel. Indoor or outdoor air pollution. What increases the risk? A weak body's defense system. This is also called the immune system. Any condition that affects your lungs and breathing, such as asthma. What are the signs or symptoms? A cough. Coughing up clear, yellow, or green mucus. Making high-pitched whistling sounds when you breathe, most often when you breathe out (wheezing). Runny or stuffy nose. Having too much mucus in your lungs (chest congestion). Shortness of breath. Body aches. A sore throat. How is this treated? Acute bronchitis may go away over time without treatment. Your doctor may tell you to: Drink more fluids. This will help thin your mucus so it is easier to cough up. Use a device that gets medicine into your lungs (inhaler). Use a vaporizer or a humidifier. These are machines that add water to the air. This helps with coughing and poor breathing. Take a medicine that thins mucus and helps clear it from your lungs. Take a medicine that prevents or stops coughing. It is not common to take an antibiotic medicine for this condition. Follow these instructions at home:  Take over-the-counter and prescription medicines only as told by your doctor. Use an inhaler, vaporizer, or humidifier as told by your  doctor. Take  two teaspoons (10 mL) of honey at bedtime. This helps lessen your coughing at night. Drink enough fluid to keep your pee (urine) pale yellow. Do not smoke or use any products that contain nicotine or tobacco. If you need help quitting, ask your doctor. Get a lot of rest. Return to your normal activities when your doctor says that it is safe. Keep all follow-up visits. How is this prevented?  Wash your hands often with soap and water for at least 20 seconds. If you cannot use soap and water, use hand sanitizer. Avoid contact with people who have cold symptoms. Try not to touch your mouth, nose, or eyes with your hands. Avoid breathing in smoke or chemical fumes. Make sure to get the flu shot every year. Contact a doctor if: Your symptoms do not get better in 2 weeks. You have trouble coughing up the mucus. Your cough keeps you awake at night. You have a fever. Get help right away if: You cough up blood. You have chest pain. You have very bad shortness of breath. You faint or keep feeling like you are going to faint. You have a very bad headache. Your fever or chills get worse. These symptoms may be an emergency. Get help right away. Call your local emergency services (911 in the [place] Do not wait to see if the symptoms will go away. Do not drive yourself to the hospital. Summary Acute bronchitis is when air tubes in the lungs (bronchi) suddenly get swollen. In adults, acute bronchitis usually goes away within 2 weeks. Drink more fluids. This will help thin your mucus so it is easier to cough up. Take over-the-counter and prescription medicines only as told by your doctor. Contact a doctor if your symptoms do not improve after 2 weeks of treatment. This information is not intended to replace advice given to you by your health care provider. Make sure you discuss any questions you have with your health care provider. Document Revised: 01/14/2021 Document Reviewed:  01/14/2021 Elsevier Patient Education  2024 Elsevier Inc.   Edwina Barth, MD [facility]

## 2023-10-25 NOTE — Assessment & Plan Note (Signed)
Clinically stable.  No red flag signs or symptoms.  No signs of pneumonia Recommend daily azithromycin for 5 days ED precautions given Advised to rest and stay well-hydrated Symptom management discussed

## 2023-10-25 NOTE — Assessment & Plan Note (Signed)
With mild acute exacerbation Recommend to start Airsupra twice a day for the next 7 days

## 2023-10-25 NOTE — Patient Instructions (Signed)
Acute Bronchitis, Adult  Acute bronchitis is when air tubes in the lungs (bronchi) suddenly get swollen. The condition can make it hard for you to breathe. In adults, acute bronchitis usually goes away within 2 weeks. A cough caused by bronchitis may last up to 3 weeks. Smoking, allergies, and asthma can make the condition worse. What are the causes? Germs that cause cold and flu (viruses). The most common cause of this condition is the virus that causes the common cold. Bacteria. Substances that bother (irritate) the lungs, including: Smoke from cigarettes and other types of tobacco. Dust and pollen. Fumes from chemicals, gases, or burned fuel. Indoor or outdoor air pollution. What increases the risk? A weak body's defense system. This is also called the immune system. Any condition that affects your lungs and breathing, such as asthma. What are the signs or symptoms? A cough. Coughing up clear, yellow, or green mucus. Making high-pitched whistling sounds when you breathe, most often when you breathe out (wheezing). Runny or stuffy nose. Having too much mucus in your lungs (chest congestion). Shortness of breath. Body aches. A sore throat. How is this treated? Acute bronchitis may go away over time without treatment. Your doctor may tell you to: Drink more fluids. This will help thin your mucus so it is easier to cough up. Use a device that gets medicine into your lungs (inhaler). Use a vaporizer or a humidifier. These are machines that add water to the air. This helps with coughing and poor breathing. Take a medicine that thins mucus and helps clear it from your lungs. Take a medicine that prevents or stops coughing. It is not common to take an antibiotic medicine for this condition. Follow these instructions at home:  Take over-the-counter and prescription medicines only as told by your doctor. Use an inhaler, vaporizer, or humidifier as told by your doctor. Take two teaspoons  (10 mL) of honey at bedtime. This helps lessen your coughing at night. Drink enough fluid to keep your pee (urine) pale yellow. Do not smoke or use any products that contain nicotine or tobacco. If you need help quitting, ask your doctor. Get a lot of rest. Return to your normal activities when your doctor says that it is safe. Keep all follow-up visits. How is this prevented?  Wash your hands often with soap and water for at least 20 seconds. If you cannot use soap and water, use hand sanitizer. Avoid contact with people who have cold symptoms. Try not to touch your mouth, nose, or eyes with your hands. Avoid breathing in smoke or chemical fumes. Make sure to get the flu shot every year. Contact a doctor if: Your symptoms do not get better in 2 weeks. You have trouble coughing up the mucus. Your cough keeps you awake at night. You have a fever. Get help right away if: You cough up blood. You have chest pain. You have very bad shortness of breath. You faint or keep feeling like you are going to faint. You have a very bad headache. Your fever or chills get worse. These symptoms may be an emergency. Get help right away. Call your local emergency services (911 in the [place] Do not wait to see if the symptoms will go away. Do not drive yourself to the hospital. Summary Acute bronchitis is when air tubes in the lungs (bronchi) suddenly get swollen. In adults, acute bronchitis usually goes away within 2 weeks. Drink more fluids. This will help thin your mucus so it is easier  to cough up. Take over-the-counter and prescription medicines only as told by your doctor. Contact a doctor if your symptoms do not improve after 2 weeks of treatment. This information is not intended to replace advice given to you by your health care provider. Make sure you discuss any questions you have with your health care provider. Document Revised: 01/14/2021 Document Reviewed: 01/14/2021 Elsevier Patient  Education  2024 [employer].

## 2023-10-25 NOTE — Assessment & Plan Note (Signed)
As a result of bronchospasm and inflammation in the airways Recommend to start Airsupra twice a day for 7 days Advised to rest and stay well-hydrated Antibiotic as prescribed

## 2023-10-25 NOTE — Assessment & Plan Note (Signed)
Cough management discussed Recommend over-the-counter Mucinex DM and cough drops Advised to rest and stay well-hydrated May use NyQuil at bedtime

## 2023-11-07 ENCOUNTER — Ambulatory Visit (INDEPENDENT_AMBULATORY_CARE_PROVIDER_SITE_OTHER): Payer: Medicare PPO

## 2023-11-07 ENCOUNTER — Ambulatory Visit: Payer: Medicare PPO | Admitting: Emergency Medicine

## 2023-11-07 ENCOUNTER — Encounter: Payer: Self-pay | Admitting: Emergency Medicine

## 2023-11-07 VITALS — BP 134/78 | HR 72 | Temp 98.4°F | Ht 73.0 in | Wt 184.0 lb

## 2023-11-07 DIAGNOSIS — Z125 Encounter for screening for malignant neoplasm of prostate: Secondary | ICD-10-CM | POA: Diagnosis not present

## 2023-11-07 DIAGNOSIS — F17200 Nicotine dependence, unspecified, uncomplicated: Secondary | ICD-10-CM

## 2023-11-07 DIAGNOSIS — Z130 Encounter for screening for diseases of the blood and blood-forming organs and certain disorders involving the immune mechanism: Secondary | ICD-10-CM

## 2023-11-07 DIAGNOSIS — Z0000 Encounter for general adult medical examination without abnormal findings: Secondary | ICD-10-CM | POA: Diagnosis not present

## 2023-11-07 DIAGNOSIS — Z1329 Encounter for screening for other suspected endocrine disorder: Secondary | ICD-10-CM

## 2023-11-07 DIAGNOSIS — Z13220 Encounter for screening for lipoid disorders: Secondary | ICD-10-CM

## 2023-11-07 DIAGNOSIS — F17210 Nicotine dependence, cigarettes, uncomplicated: Secondary | ICD-10-CM | POA: Diagnosis not present

## 2023-11-07 DIAGNOSIS — Z87891 Personal history of nicotine dependence: Secondary | ICD-10-CM | POA: Diagnosis not present

## 2023-11-07 DIAGNOSIS — Z1321 Encounter for screening for nutritional disorder: Secondary | ICD-10-CM | POA: Diagnosis not present

## 2023-11-07 DIAGNOSIS — Z13228 Encounter for screening for other metabolic disorders: Secondary | ICD-10-CM

## 2023-11-07 DIAGNOSIS — J449 Chronic obstructive pulmonary disease, unspecified: Secondary | ICD-10-CM | POA: Diagnosis not present

## 2023-11-07 LAB — CBC WITH DIFFERENTIAL/PLATELET
Basophils Absolute: 0 10*3/uL (ref 0.0–0.1)
Basophils Relative: 0.4 % (ref 0.0–3.0)
Eosinophils Absolute: 0.1 10*3/uL (ref 0.0–0.7)
Eosinophils Relative: 1.1 % (ref 0.0–5.0)
HCT: 45.8 % (ref 39.0–52.0)
Hemoglobin: 15.5 g/dL (ref 13.0–17.0)
Lymphocytes Relative: 24.8 % (ref 12.0–46.0)
Lymphs Abs: 1.3 10*3/uL (ref 0.7–4.0)
MCHC: 33.8 g/dL (ref 30.0–36.0)
MCV: 92.8 fL (ref 78.0–100.0)
Monocytes Absolute: 0.4 10*3/uL (ref 0.1–1.0)
Monocytes Relative: 7.6 % (ref 3.0–12.0)
Neutro Abs: 3.6 10*3/uL (ref 1.4–7.7)
Neutrophils Relative %: 66.1 % (ref 43.0–77.0)
Platelets: 251 10*3/uL (ref 150.0–400.0)
RBC: 4.93 Mil/uL (ref 4.22–5.81)
RDW: 12.7 % (ref 11.5–15.5)
WBC: 5.4 10*3/uL (ref 4.0–10.5)

## 2023-11-07 LAB — COMPREHENSIVE METABOLIC PANEL
ALT: 15 U/L (ref 0–53)
AST: 21 U/L (ref 0–37)
Albumin: 4.6 g/dL (ref 3.5–5.2)
Alkaline Phosphatase: 84 U/L (ref 39–117)
BUN: 15 mg/dL (ref 6–23)
CO2: 31 meq/L (ref 19–32)
Calcium: 9.3 mg/dL (ref 8.4–10.5)
Chloride: 100 meq/L (ref 96–112)
Creatinine, Ser: 0.79 mg/dL (ref 0.40–1.50)
GFR: 91.26 mL/min (ref >60.00)
Glucose, Bld: 86 mg/dL (ref 70–99)
Potassium: 4.9 meq/L (ref 3.5–5.1)
Sodium: 137 meq/L (ref 135–145)
Total Bilirubin: 1.4 mg/dL — ABNORMAL HIGH (ref 0.2–1.2)
Total Protein: 7 g/dL (ref 6.0–8.3)

## 2023-11-07 LAB — LIPID PANEL
Cholesterol: 182 mg/dL (ref 0–200)
HDL: 55.2 mg/dL (ref >39.00)
LDL Cholesterol: 110 mg/dL — ABNORMAL HIGH (ref 0–99)
NonHDL: 127.1
Total CHOL/HDL Ratio: 3
Triglycerides: 86 mg/dL (ref 0.0–149.0)
VLDL: 17.2 mg/dL (ref 0.0–40.0)

## 2023-11-07 LAB — PSA: PSA: 1.08 ng/mL (ref 0.10–4.00)

## 2023-11-07 LAB — HEMOGLOBIN A1C: Hgb A1c MFr Bld: 5.5 % (ref 4.6–6.5)

## 2023-11-07 NOTE — Patient Instructions (Signed)
 Health Maintenance, Male  Adopting a healthy lifestyle and getting preventive care are important in promoting health and wellness. Ask your health care provider about:  The right schedule for you to have regular tests and exams.  Things you can do on your own to prevent diseases and keep yourself healthy.  What should I know about diet, weight, and exercise?  Eat a healthy diet    Eat a diet that includes plenty of vegetables, fruits, low-fat dairy products, and lean protein.  Do not eat a lot of foods that are high in solid fats, added sugars, or sodium.  Maintain a healthy weight  Body mass index (BMI) is a measurement that can be used to identify possible weight problems. It estimates body fat based on height and weight. Your health care provider can help determine your BMI and help you achieve or maintain a healthy weight.  Get regular exercise  Get regular exercise. This is one of the most important things you can do for your health. Most adults should:  Exercise for at least 150 minutes each week. The exercise should increase your heart rate and make you sweat (moderate-intensity exercise).  Do strengthening exercises at least twice a week. This is in addition to the moderate-intensity exercise.  Spend less time sitting. Even light physical activity can be beneficial.  Watch cholesterol and blood lipids  Have your blood tested for lipids and cholesterol at 69 years of age, then have this test every 5 years.  You may need to have your cholesterol levels checked more often if:  Your lipid or cholesterol levels are high.  You are older than 69 years of age.  You are at high risk for heart disease.  What should I know about cancer screening?  Many types of cancers can be detected early and may often be prevented. Depending on your health history and family history, you may need to have cancer screening at various ages. This may include screening for:  Colorectal cancer.  Prostate cancer.  Skin cancer.  Lung  cancer.  What should I know about heart disease, diabetes, and high blood pressure?  Blood pressure and heart disease  High blood pressure causes heart disease and increases the risk of stroke. This is more likely to develop in people who have high blood pressure readings or are overweight.  Talk with your health care provider about your target blood pressure readings.  Have your blood pressure checked:  Every 3-5 years if you are 9-95 years of age.  Every year if you are 85 years old or older.  If you are between the ages of 29 and 29 and are a current or former smoker, ask your health care provider if you should have a one-time screening for abdominal aortic aneurysm (AAA).  Diabetes  Have regular diabetes screenings. This checks your fasting blood sugar level. Have the screening done:  Once every three years after age 23 if you are at a normal weight and have a low risk for diabetes.  More often and at a younger age if you are overweight or have a high risk for diabetes.  What should I know about preventing infection?  Hepatitis B  If you have a higher risk for hepatitis B, you should be screened for this virus. Talk with your health care provider to find out if you are at risk for hepatitis B infection.  Hepatitis C  Blood testing is recommended for:  Everyone born from [birth year] through 1966.  Anyone  with known risk factors for hepatitis C.  Sexually transmitted infections (STIs)  You should be screened each year for STIs, including gonorrhea and chlamydia, if:  You are sexually active and are younger than 69 years of age.  You are older than 69 years of age and your health care provider tells you that you are at risk for this type of infection.  Your sexual activity has changed since you were last screened, and you are at increased risk for chlamydia or gonorrhea. Ask your health care provider if you are at risk.  Ask your health care provider about whether you are at high risk for HIV. Your health care provider  may recommend a prescription medicine to help prevent HIV infection. If you choose to take medicine to prevent HIV, you should first get tested for HIV. You should then be tested every 3 months for as long as you are taking the medicine.  Follow these instructions at home:  Alcohol use  Do not drink alcohol if your health care provider tells you not to drink.  If you drink alcohol:  Limit how much you have to 0-2 drinks a day.  Know how much alcohol is in your drink. In the [place] one drink equals one 12 oz bottle of beer (355 mL), one 5 oz glass of wine (148 mL), or one 1 oz glass of hard liquor (44 mL).  Lifestyle  Do not use any products that contain nicotine or tobacco. These products include cigarettes, chewing tobacco, and vaping devices, such as e-cigarettes. If you need help quitting, ask your health care provider.  Do not use street drugs.  Do not share needles.  Ask your health care provider for help if you need support or information about quitting drugs.  General instructions  Schedule regular health, dental, and eye exams.  Stay current with your vaccines.  Tell your health care provider if:  You often feel depressed.  You have ever been abused or do not feel safe at home.  Summary  Adopting a healthy lifestyle and getting preventive care are important in promoting health and wellness.  Follow your health care provider's instructions about healthy diet, exercising, and getting tested or screened for diseases.  Follow your health care provider's instructions on monitoring your cholesterol and blood pressure.  This information is not intended to replace advice given to you by your health care provider. Make sure you discuss any questions you have with your health care provider.  Document Revised: 02/02/2021 Document Reviewed: 02/02/2021  Elsevier Patient Education  2024 [employer].

## 2023-11-07 NOTE — Progress Notes (Signed)
 Taylor Morton 68 y.o.   Chief Complaint  Patient presents with   Follow-up    6 month f/u. Wanting lab work done. Patient states wanting to talk to provider about his questions     HISTORY OF PRESENT ILLNESS: This is a 68 y.o. male here for annual exam. Overall doing well.  Has no complaints or medical concerns today.  HPI   Prior to Admission medications   Medication Sig Start Date End Date Taking? Authorizing Provider  alfuzosin (UROXATRAL) 10 MG 24 hr tablet Take 10 mg by mouth daily. 08/21/22  Yes [provider]  finasteride (PROSCAR) 5 MG tablet Take 5 mg by mouth daily. 12/30/21  Yes [provider]  tadalafil  (CIALIS ) 20 MG tablet Take 0.5 tablets (10 mg total) by mouth daily as needed for erectile dysfunction. 09/17/22  Yes Elvira Hammersmith, MD    Allergies  Allergen Reactions   Dust Mite Extract     cough    Patient Active Problem List   Diagnosis Date Noted   Benign prostatic hyperplasia without lower urinary tract symptoms 05/02/2023   History of BPH 04/28/2022   Coronary artery calcification 02/28/2017   Asthma     Past Medical History:  Diagnosis Date   Asthma    caused by dust - no inhaler   Bradycardia    Chest pain    no current problems   COPD (chronic obstructive pulmonary disease) (HCC)     Past Surgical History:  Procedure Laterality Date   APPENDECTOMY      Social History   Socioeconomic History   Marital status: Married    Spouse name: Bernabe Brew   Number of children: 2   Years of education: 16   Highest education level: Bachelor's degree (e.g., BA, AB, BS)  Occupational History   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[employer]    Comment: Earth Engineer, water Issues  Tobacco Use   Smoking status: Former    Current packs/day: 0.00    Average packs/day: 1 pack/day for 10.0 years (10.0 ttl pk-yrs)    Types: Cigarettes    Start date: 15    Quit date: 1992    Years since quitting: 33.1    Smokeless tobacco: Never  Vaping Use   Vaping status: Never Used  Substance and Sexual Activity   Alcohol use: Yes    Alcohol/week: 7.0 - 14.0 standard drinks of alcohol    Types: 7 - 14 Standard drinks or equivalent per week    Comment: 1-2 day beer / liquor   Drug use: No   Sexual activity: Yes  Other Topics Concern   Not on file  Social History Narrative   Lives with his wife.  Sons are both in college.   Social Drivers of Corporate investment banker Strain: Low Risk  (10/25/2023)   Overall Financial Resource Strain (CARDIA)    Difficulty of Paying Living Expenses: Not hard at all  Food Insecurity: No Food Insecurity (10/25/2023)   Hunger Vital Sign    Worried About Running Out of Food in the Last Year: Never true    Ran Out of Food in the Last Year: Never true  Transportation Needs: No Transportation Needs (10/25/2023)   PRAPARE - Administrator, Civil Service (Medical): No    Lack of Transportation (Non-Medical): No  Physical Activity: Sufficiently Active (10/25/2023)   Exercise Vital Sign    Days of Exercise per Week: 6 days  Minutes of Exercise per Session: 100 min  Stress: No Stress Concern Present (10/25/2023)   [employer] of Occupational Health - Occupational Stress Questionnaire    Feeling of Stress : Only a little  Social Connections: Moderately Integrated (10/25/2023)   Social Connection and Isolation Panel [NHANES]    Frequency of Communication with Friends and Family: Twice a week    Frequency of Social Gatherings with Friends and Family: Once a week    Attends Religious Services: Never    Database administrator or Organizations: Yes    Attends Engineer, structural: More than 4 times per year    Marital Status: Married  Recent Concern: Social Connections - Moderately Isolated (09/16/2023)   Social Connection and Isolation Panel [NHANES]    Frequency of Communication with Friends and Family: More than three times a week     Frequency of Social Gatherings with Friends and Family: Once a week    Attends Religious Services: Never    Database administrator or Organizations: No    Attends Banker Meetings: Never    Marital Status: Married  Catering manager Violence: Not At Risk (09/16/2023)   Humiliation, Afraid, Rape, and Kick questionnaire    Fear of Current or Ex-Partner: No    Emotionally Abused: No    Physically Abused: No    Sexually Abused: No    Family History  Problem Relation Age of Onset   Cancer Father    Arthritis Mother    Colon cancer Neg Hx    Rectal cancer Neg Hx    Stomach cancer Neg Hx      Review of Systems  Constitutional: Negative.  Negative for chills and fever.  HENT: Negative.  Negative for congestion and sore throat.   Respiratory: Negative.  Negative for cough and shortness of breath.   Cardiovascular: Negative.  Negative for chest pain and palpitations.  Gastrointestinal:  Negative for abdominal pain, diarrhea, nausea and vomiting.  Genitourinary: Negative.  Negative for dysuria and hematuria.  Skin: Negative.  Negative for rash.  Neurological: Negative.  Negative for dizziness and headaches.  All other systems reviewed and are negative.   Today's Vitals   11/07/23 0845  BP: 134/78  Pulse: 72  Temp: 98.4 F (36.9 C)  TempSrc: Oral  SpO2: 96%  Weight: 184 lb (83.5 kg)  Height: 6\' 1"  (1.854 m)   Body mass index is 24.28 kg/m.   Physical Exam Vitals reviewed.  Constitutional:      Appearance: Normal appearance.  HENT:     Head: Normocephalic.     Right Ear: There is impacted cerumen.     Left Ear: Tympanic membrane, ear canal and external ear normal.     Mouth/Throat:     Mouth: Mucous membranes are moist.     Pharynx: Oropharynx is clear.  Eyes:     Extraocular Movements: Extraocular movements intact.     Conjunctiva/sclera: Conjunctivae normal.     Pupils: Pupils are equal, round, and reactive to light.  Neck:     Vascular: No carotid  bruit.  Cardiovascular:     Rate and Rhythm: Normal rate and regular rhythm.     Pulses: Normal pulses.     Heart sounds: Normal heart sounds.  Pulmonary:     Effort: Pulmonary effort is normal.     Breath sounds: Normal breath sounds.  Abdominal:     Palpations: Abdomen is soft.     Tenderness: There is no abdominal tenderness.  Musculoskeletal:     Cervical back: No tenderness.     Right lower leg: No edema.     Left lower leg: No edema.  Lymphadenopathy:     Cervical: No cervical adenopathy.  Skin:    General: Skin is warm and dry.     Capillary Refill: Capillary refill takes less than 2 seconds.  Neurological:     General: No focal deficit present.     Mental Status: He is alert and oriented to person, place, and time.  Psychiatric:        Mood and Affect: Mood normal.        Behavior: Behavior normal.      ASSESSMENT & PLAN: Problem List Items Addressed This Visit       Other   Current smoker   Relevant Orders   DG Chest 2 View   CBC with Differential/Platelet   Other Visit Diagnoses       Routine general medical examination at a health care facility    -  Primary   Relevant Orders   DG Chest 2 View   CBC with Differential/Platelet   Comprehensive metabolic panel   PSA   Hemoglobin A1c   Lipid panel     Screening for deficiency anemia       Relevant Orders   CBC with Differential/Platelet     Screening for lipoid disorders       Relevant Orders   Lipid panel     Screening for endocrine, metabolic and immunity disorder       Relevant Orders   Comprehensive metabolic panel   Hemoglobin A1c     Screening for prostate cancer       Relevant Orders   PSA      Modifiable risk factors discussed with patient. Anticipatory guidance according to age provided. The following topics were also discussed: Social Determinants of Health Smoking and cardiovascular/cancer risks associated with this.  Smoking cessation advised given Diet and nutrition Benefits  of exercise Cancer screening and review of most recent colonoscopy report from 2021 Vaccinations review and recommendations Cardiovascular risk assessment and need for blood work The 10-year ASCVD risk score (Arnett DK, et al., 2019) is: 16.4%   Values used to calculate the score:     Age: 13 years     Sex: Male     Is Non-Hispanic African American: No     Diabetic: No     Tobacco smoker: No     Systolic Blood Pressure: 134 mmHg     Is BP treated: No     HDL Cholesterol: 52.8 mg/dL     Total Cholesterol: 197 mg/dL  Mental health including depression and anxiety Fall and accident prevention  Patient Instructions  Health Maintenance, Male Adopting a healthy lifestyle and getting preventive care are important in promoting health and wellness. Ask your health care provider about: The right schedule for you to have regular tests and exams. Things you can do on your own to prevent diseases and keep yourself healthy. What should I know about diet, weight, and exercise? Eat a healthy diet  Eat a diet that includes plenty of vegetables, fruits, low-fat dairy products, and lean protein. Do not eat a lot of foods that are high in solid fats, added sugars, or sodium. Maintain a healthy weight Body mass index (BMI) is a measurement that can be used to identify possible weight problems. It estimates body fat based on height and weight. Your health care provider can help determine  your BMI and help you achieve or maintain a healthy weight. Get regular exercise Get regular exercise. This is one of the most important things you can do for your health. Most adults should: Exercise for at least 150 minutes each week. The exercise should increase your heart rate and make you sweat (moderate-intensity exercise). Do strengthening exercises at least twice a week. This is in addition to the moderate-intensity exercise. Spend less time sitting. Even light physical activity can be beneficial. Watch  cholesterol and blood lipids Have your blood tested for lipids and cholesterol at 69 years of age, then have this test every 5 years. You may need to have your cholesterol levels checked more often if: Your lipid or cholesterol levels are high. You are older than 69 years of age. You are at high risk for heart disease. What should I know about cancer screening? Many types of cancers can be detected early and may often be prevented. Depending on your health history and family history, you may need to have cancer screening at various ages. This may include screening for: Colorectal cancer. Prostate cancer. Skin cancer. Lung cancer. What should I know about heart disease, diabetes, and high blood pressure? Blood pressure and heart disease High blood pressure causes heart disease and increases the risk of stroke. This is more likely to develop in people who have high blood pressure readings or are overweight. Talk with your health care provider about your target blood pressure readings. Have your blood pressure checked: Every 3-5 years if you are 30-32 years of age. Every year if you are 46 years old or older. If you are between the ages of 80 and 25 and are a current or former smoker, ask your health care provider if you should have a one-time screening for abdominal aortic aneurysm (AAA). Diabetes Have regular diabetes screenings. This checks your fasting blood sugar level. Have the screening done: Once every three years after age 65 if you are at a normal weight and have a low risk for diabetes. More often and at a younger age if you are overweight or have a high risk for diabetes. What should I know about preventing infection? Hepatitis B If you have a higher risk for hepatitis B, you should be screened for this virus. Talk with your health care provider to find out if you are at risk for hepatitis B infection. Hepatitis C Blood testing is recommended for: Everyone born from [birth year] through  1965. Anyone with known risk factors for hepatitis C. Sexually transmitted infections (STIs) You should be screened each year for STIs, including gonorrhea and chlamydia, if: You are sexually active and are younger than 68 years of age. You are older than 69 years of age and your health care provider tells you that you are at risk for this type of infection. Your sexual activity has changed since you were last screened, and you are at increased risk for chlamydia or gonorrhea. Ask your health care provider if you are at risk. Ask your health care provider about whether you are at high risk for HIV. Your health care provider may recommend a prescription medicine to help prevent HIV infection. If you choose to take medicine to prevent HIV, you should first get tested for HIV. You should then be tested every 3 months for as long as you are taking the medicine. Follow these instructions at home: Alcohol use Do not drink alcohol if your health care provider tells you not to drink. If you drink  alcohol: Limit how much you have to 0-2 drinks a day. Know how much alcohol is in your drink. In the [place] one drink equals one 12 oz bottle of beer (355 mL), one 5 oz glass of wine (148 mL), or one 1 oz glass of hard liquor (44 mL). Lifestyle Do not use any products that contain nicotine or tobacco. These products include cigarettes, chewing tobacco, and vaping devices, such as e-cigarettes. If you need help quitting, ask your health care provider. Do not use street drugs. Do not share needles. Ask your health care provider for help if you need support or information about quitting drugs. General instructions Schedule regular health, dental, and eye exams. Stay current with your vaccines. Tell your health care provider if: You often feel depressed. You have ever been abused or do not feel safe at home. Summary Adopting a healthy lifestyle and getting preventive care are important in promoting health and  wellness. Follow your health care provider's instructions about healthy diet, exercising, and getting tested or screened for diseases. Follow your health care provider's instructions on monitoring your cholesterol and blood pressure. This information is not intended to replace advice given to you by your health care provider. Make sure you discuss any questions you have with your health care provider. Document Revised: 02/02/2021 Document Reviewed: 02/02/2021 Elsevier Patient Education  2024 Elsevier Inc.     Maryagnes Small, MD [facility]

## 2023-11-11 ENCOUNTER — Other Ambulatory Visit (HOSPITAL_COMMUNITY): Payer: Self-pay

## 2023-12-19 DIAGNOSIS — L578 Other skin changes due to chronic exposure to nonionizing radiation: Secondary | ICD-10-CM | POA: Diagnosis not present

## 2023-12-19 DIAGNOSIS — D1801 Hemangioma of skin and subcutaneous tissue: Secondary | ICD-10-CM | POA: Diagnosis not present

## 2023-12-19 DIAGNOSIS — L821 Other seborrheic keratosis: Secondary | ICD-10-CM | POA: Diagnosis not present

## 2023-12-19 DIAGNOSIS — D229 Melanocytic nevi, unspecified: Secondary | ICD-10-CM | POA: Diagnosis not present

## 2023-12-19 DIAGNOSIS — L720 Epidermal cyst: Secondary | ICD-10-CM | POA: Diagnosis not present

## 2023-12-19 DIAGNOSIS — L814 Other melanin hyperpigmentation: Secondary | ICD-10-CM | POA: Diagnosis not present

## 2023-12-26 DIAGNOSIS — D49512 Neoplasm of unspecified behavior of left kidney: Secondary | ICD-10-CM | POA: Diagnosis not present

## 2024-01-02 DIAGNOSIS — D49512 Neoplasm of unspecified behavior of left kidney: Secondary | ICD-10-CM | POA: Diagnosis not present

## 2024-01-02 DIAGNOSIS — N401 Enlarged prostate with lower urinary tract symptoms: Secondary | ICD-10-CM | POA: Diagnosis not present

## 2024-01-02 DIAGNOSIS — R351 Nocturia: Secondary | ICD-10-CM | POA: Diagnosis not present

## 2024-01-02 DIAGNOSIS — N486 Induration penis plastica: Secondary | ICD-10-CM | POA: Diagnosis not present

## 2024-01-02 DIAGNOSIS — N5201 Erectile dysfunction due to arterial insufficiency: Secondary | ICD-10-CM | POA: Diagnosis not present

## 2024-01-03 ENCOUNTER — Encounter: Payer: Self-pay | Admitting: Urology

## 2024-01-03 ENCOUNTER — Other Ambulatory Visit: Payer: Self-pay | Admitting: Urology

## 2024-01-03 DIAGNOSIS — D49512 Neoplasm of unspecified behavior of left kidney: Secondary | ICD-10-CM

## 2024-02-13 ENCOUNTER — Ambulatory Visit
Admission: RE | Admit: 2024-02-13 | Discharge: 2024-02-13 | Disposition: A | Discharge status: Home / Self Care | Source: Ambulatory Visit | Attending: Urology | Admitting: Urology

## 2024-02-13 DIAGNOSIS — N2889 Other specified disorders of kidney and ureter: Secondary | ICD-10-CM | POA: Diagnosis not present

## 2024-02-13 DIAGNOSIS — D49512 Neoplasm of unspecified behavior of left kidney: Secondary | ICD-10-CM

## 2024-02-13 MED ORDER — GADOPICLENOL 0.5 MMOL/ML IV SOLN
8.0000 mL | Freq: Once | INTRAVENOUS | Status: AC | PRN
  Administered 2024-02-13: 8 mL via INTRAVENOUS

## 2024-07-02 DIAGNOSIS — N401 Enlarged prostate with lower urinary tract symptoms: Secondary | ICD-10-CM | POA: Diagnosis not present

## 2024-08-02 ENCOUNTER — Ambulatory Visit: Payer: Self-pay

## 2024-08-02 NOTE — Telephone Encounter (Signed)
  FYI Only or Action Required?: FYI only for provider: appointment scheduled on 08/07/2024.  Patient was last seen in primary care on 11/07/2023 by Purcell Emil Schanz, MD.  Called Nurse Triage reporting Hip Pain.  Symptoms began several months ago.  Interventions attempted: Other: activity modification.  Symptoms are: unchanged.  Triage Disposition: See PCP Within 2 Weeks  Patient/caregiver understands and will follow disposition?: Yes Copied from CRM #[id number]. Topic: Clinical - Red Word Triage >> Aug 02, 2024  3:41 PM Victoria A wrote: [facility] that prompted transfer to Nurse Triage: Patient is having pain in hip joint that has been onging since June Reason for Disposition  [1] MILD pain (e.g., does not interfere with normal activities) AND [2] present > 7 days  Answer Assessment - Initial Assessment Questions 1. LOCATION and RADIATION: Where is the pain located? Does the pain spread (shoot) anywhere else?     Left hip pain, left groin pain, muscle soreness, leg cramps  3. SEVERITY: How bad is the pain? What does it keep you from doing?   (Scale 1-10; or mild, moderate, severe)     1 at rest, with  4. ONSET: When did the pain start? Does it come and go, or is it there all the time?     June 2025 5. WORK OR EXERCISE: Has there been any recent work or exercise that involved this part of the body?      Became sore after being on vacation and hiking 6. CAUSE: What do you think is causing the hip pain?      Related to activities on vacation 7. AGGRAVATING FACTORS: What makes the hip pain worse? (e.g., walking, climbing stairs, running)     Activity related pain 8. OTHER SYMPTOMS: Do you have any other symptoms? (e.g., back pain, pain shooting down leg,  fever, rash)     Leg cramps  Protocols used: Hip Pain-A-AH

## 2024-08-03 NOTE — Telephone Encounter (Signed)
 Pt has been scheduled.

## 2024-08-07 ENCOUNTER — Encounter: Payer: Self-pay | Admitting: Emergency Medicine

## 2024-08-07 ENCOUNTER — Ambulatory Visit: Admitting: Emergency Medicine

## 2024-08-15 ENCOUNTER — Ambulatory Visit: Admitting: Emergency Medicine

## 2024-08-16 ENCOUNTER — Ambulatory Visit (INDEPENDENT_AMBULATORY_CARE_PROVIDER_SITE_OTHER): Admitting: Emergency Medicine

## 2024-08-16 ENCOUNTER — Encounter: Payer: Self-pay | Admitting: Emergency Medicine

## 2024-08-16 VITALS — BP 138/80 | HR 66 | Temp 98.2°F | Ht 73.0 in | Wt 180.0 lb

## 2024-08-16 DIAGNOSIS — G8929 Other chronic pain: Secondary | ICD-10-CM | POA: Diagnosis not present

## 2024-08-16 DIAGNOSIS — Z0000 Encounter for general adult medical examination without abnormal findings: Secondary | ICD-10-CM | POA: Diagnosis not present

## 2024-08-16 DIAGNOSIS — M25552 Pain in left hip: Secondary | ICD-10-CM

## 2024-08-16 DIAGNOSIS — Z125 Encounter for screening for malignant neoplasm of prostate: Secondary | ICD-10-CM | POA: Diagnosis not present

## 2024-08-16 DIAGNOSIS — Z130 Encounter for screening for diseases of the blood and blood-forming organs and certain disorders involving the immune mechanism: Secondary | ICD-10-CM | POA: Diagnosis not present

## 2024-08-16 DIAGNOSIS — Z13228 Encounter for screening for other metabolic disorders: Secondary | ICD-10-CM | POA: Diagnosis not present

## 2024-08-16 DIAGNOSIS — Z1329 Encounter for screening for other suspected endocrine disorder: Secondary | ICD-10-CM | POA: Diagnosis not present

## 2024-08-16 DIAGNOSIS — Z0001 Encounter for general adult medical examination with abnormal findings: Secondary | ICD-10-CM

## 2024-08-16 DIAGNOSIS — Z13220 Encounter for screening for lipoid disorders: Secondary | ICD-10-CM | POA: Diagnosis not present

## 2024-08-16 LAB — LIPID PANEL
Cholesterol: 178 mg/dL (ref 0–200)
HDL: 68.4 mg/dL (ref >39.00)
LDL Cholesterol: 99 mg/dL (ref 0–99)
NonHDL: 109.35
Total CHOL/HDL Ratio: 3
Triglycerides: 52 mg/dL (ref 0.0–149.0)
VLDL: 10.4 mg/dL (ref 0.0–40.0)

## 2024-08-16 LAB — COMPREHENSIVE METABOLIC PANEL WITH GFR
ALT: 28 U/L (ref 0–53)
AST: 32 U/L (ref 0–37)
Albumin: 4.7 g/dL (ref 3.5–5.2)
Alkaline Phosphatase: 72 U/L (ref 39–117)
BUN: 13 mg/dL (ref 6–23)
CO2: 29 meq/L (ref 19–32)
Calcium: 9.2 mg/dL (ref 8.4–10.5)
Chloride: 100 meq/L (ref 96–112)
Creatinine, Ser: 0.75 mg/dL (ref 0.40–1.50)
GFR: 92.2 mL/min (ref >60.00)
Glucose, Bld: 82 mg/dL (ref 70–99)
Potassium: 4.1 meq/L (ref 3.5–5.1)
Sodium: 135 meq/L (ref 135–145)
Total Bilirubin: 1.2 mg/dL (ref 0.2–1.2)
Total Protein: 7 g/dL (ref 6.0–8.3)

## 2024-08-16 LAB — CBC WITH DIFFERENTIAL/PLATELET
Basophils Absolute: 0 K/uL (ref 0.0–0.1)
Basophils Relative: 0.4 % (ref 0.0–3.0)
Eosinophils Absolute: 0 K/uL (ref 0.0–0.7)
Eosinophils Relative: 0.4 % (ref 0.0–5.0)
HCT: 41.6 % (ref 39.0–52.0)
Hemoglobin: 14.2 g/dL (ref 13.0–17.0)
Lymphocytes Relative: 20.1 % (ref 12.0–46.0)
Lymphs Abs: 1.3 K/uL (ref 0.7–4.0)
MCHC: 34.2 g/dL (ref 30.0–36.0)
MCV: 93 fl (ref 78.0–100.0)
Monocytes Absolute: 0.4 K/uL (ref 0.1–1.0)
Monocytes Relative: 6 % (ref 3.0–12.0)
Neutro Abs: 4.8 K/uL (ref 1.4–7.7)
Neutrophils Relative %: 73.1 % (ref 43.0–77.0)
Platelets: 255 K/uL (ref 150.0–400.0)
RBC: 4.47 Mil/uL (ref 4.22–5.81)
RDW: 12.6 % (ref 11.5–15.5)
WBC: 6.5 K/uL (ref 4.0–10.5)

## 2024-08-16 LAB — HEMOGLOBIN A1C: Hgb A1c MFr Bld: 5.3 % (ref 4.6–6.5)

## 2024-08-16 LAB — PSA: PSA: 1.06 ng/mL (ref 0.10–4.00)

## 2024-08-16 NOTE — Progress Notes (Signed)
 Taylor Morton 68 y.o.   Chief Complaint  Patient presents with   Follow-up    Pt states that he has some problems in his hip area on the left side/ pt states that he has sharp shooting pain in the heel og his foot in the left side     HISTORY OF PRESENT ILLNESS: This is a 68 y.o. male here for annual exam Also complaining of pain to left hip area at times radiating down the left leg on and off for the past several weeks No other complaints or medical concerns today Eats well and exercises regularly.  HPI   Prior to Admission medications   Medication Sig Start Date End Date Taking? Authorizing Provider  alfuzosin (UROXATRAL) 10 MG 24 hr tablet Take 10 mg by mouth daily. 08/21/22  Yes [provider]  finasteride (PROSCAR) 5 MG tablet Take 5 mg by mouth daily. 12/30/21  Yes [provider]  tadalafil  (CIALIS ) 20 MG tablet Take 0.5 tablets (10 mg total) by mouth daily as needed for erectile dysfunction. 09/17/22  Yes Purcell Emil Schanz, MD    Allergies  Allergen Reactions   Dust Mite Extract     cough    Patient Active Problem List   Diagnosis Date Noted   Current smoker 11/07/2023   Benign prostatic hyperplasia without lower urinary tract symptoms 05/02/2023   History of BPH 04/28/2022   Coronary artery calcification 02/28/2017   Asthma     Past Medical History:  Diagnosis Date   Asthma    caused by dust - no inhaler   Bradycardia    Chest pain    no current problems   COPD (chronic obstructive pulmonary disease) (HCC)     Past Surgical History:  Procedure Laterality Date   APPENDECTOMY      Social History   Socioeconomic History   Marital status: Married    Spouse name: Lonell   Number of children: 2   Years of education: 16   Highest education level: Bachelor's degree (e.g., BA, AB, BS)  Occupational History   Occupation: Advertising Account Executive: [employer]    Comment: Earth Engineer, [employer]  Tobacco  Use   Smoking status: Former    Current packs/day: 0.00    Average packs/day: 1 pack/day for 10.0 years (10.0 ttl pk-yrs)    Types: Cigarettes    Start date: 85    Quit date: 1992    Years since quitting: 33.9   Smokeless tobacco: Never  Vaping Use   Vaping status: Never Used  Substance and Sexual Activity   Alcohol use: Yes    Alcohol/week: 7.0 - 14.0 standard drinks of alcohol    Types: 7 - 14 Standard drinks or equivalent per week    Comment: 1-2 day beer / liquor   Drug use: No   Sexual activity: Yes  Other Topics Concern   Not on file  Social History Narrative   Lives with his wife.  Sons are both in college.   Social Drivers of Corporate Investment Banker Strain: Low Risk  (08/05/2024)   Overall Financial Resource Strain (CARDIA)    Difficulty of Paying Living Expenses: Not hard at all  Food Insecurity: No Food Insecurity (08/05/2024)   Hunger Vital Sign    Worried About Running Out of Food in the Last Year: Never true    Ran Out of Food in the Last Year: Never true  Transportation Needs: No Transportation Needs (  08/05/2024)   PRAPARE - Administrator, Civil Service (Medical): No    Lack of Transportation (Non-Medical): No  Physical Activity: Sufficiently Active (08/05/2024)   Exercise Vital Sign    Days of Exercise per Week: 7 days    Minutes of Exercise per Session: 150+ min  Stress: No Stress Concern Present (08/05/2024)   [employer] of Occupational Health - Occupational Stress Questionnaire    Feeling of Stress: Not at all  Social Connections: Socially Integrated (08/05/2024)   Social Connection and Isolation Panel    Frequency of Communication with Friends and Family: More than three times a week    Frequency of Social Gatherings with Friends and Family: Three times a week    Attends Religious Services: 1 to 4 times per year    Active Member of Clubs or Organizations: Yes    Attends Banker Meetings: More than 4 times per year     Marital Status: Married  Catering Manager Violence: Not At Risk (09/16/2023)   Humiliation, Afraid, Rape, and Kick questionnaire    Fear of Current or Ex-Partner: No    Emotionally Abused: No    Physically Abused: No    Sexually Abused: No    Family History  Problem Relation Age of Onset   Cancer Father    Arthritis Mother    Colon cancer Neg Hx    Rectal cancer Neg Hx    Stomach cancer Neg Hx      Review of Systems  Constitutional: Negative.  Negative for chills and fever.  HENT: Negative.  Negative for congestion and sore throat.   Respiratory: Negative.  Negative for cough and shortness of breath.   Cardiovascular: Negative.  Negative for chest pain and palpitations.  Gastrointestinal:  Negative for abdominal pain, nausea and vomiting.  Genitourinary: Negative.  Negative for dysuria and hematuria.  Skin: Negative.  Negative for rash.  Neurological: Negative.  Negative for dizziness and headaches.  All other systems reviewed and are negative.   Vitals:   08/16/24 1303  BP: 138/80  Pulse: 66  Temp: 98.2 F (36.8 C)  SpO2: 98%    Physical Exam Vitals reviewed.  Constitutional:      Appearance: Normal appearance.  HENT:     Head: Normocephalic.     Right Ear: Tympanic membrane, ear canal and external ear normal.     Left Ear: Tympanic membrane, ear canal and external ear normal.     Mouth/Throat:     Mouth: Mucous membranes are moist.     Pharynx: Oropharynx is clear.  Eyes:     Extraocular Movements: Extraocular movements intact.     Conjunctiva/sclera: Conjunctivae normal.     Pupils: Pupils are equal, round, and reactive to light.  Cardiovascular:     Rate and Rhythm: Normal rate and regular rhythm.     Pulses: Normal pulses.     Heart sounds: Normal heart sounds.  Pulmonary:     Effort: Pulmonary effort is normal.     Breath sounds: Normal breath sounds.  Abdominal:     Palpations: Abdomen is soft.     Tenderness: There is no abdominal tenderness.   Musculoskeletal:     Cervical back: No tenderness.  Lymphadenopathy:     Cervical: No cervical adenopathy.  Skin:    General: Skin is warm and dry.     Capillary Refill: Capillary refill takes less than 2 seconds.  Neurological:     General: No focal deficit present.  Mental Status: He is alert and oriented to person, place, and time.  Psychiatric:        Mood and Affect: Mood normal.        Behavior: Behavior normal.      ASSESSMENT & PLAN: Problem List Items Addressed This Visit       Other   Chronic left hip pain   Active and affecting quality of life Most likely musculoskeletal type of injury Differential diagnosis discussed Recommend sports medicine evaluation May benefit from physical therapy May need x-ray Pain management discussed      Relevant Orders   Ambulatory referral to Sports Medicine   Other Visit Diagnoses       Encounter for general adult medical examination with abnormal findings    -  Primary   Relevant Orders   CBC with Differential/Platelet   Comprehensive metabolic panel with GFR   Hemoglobin A1c   Lipid panel   PSA     Screening for deficiency anemia       Relevant Orders   CBC with Differential/Platelet     Screening for lipoid disorders       Relevant Orders   Lipid panel     Screening for endocrine, metabolic and immunity disorder       Relevant Orders   Comprehensive metabolic panel with GFR   Hemoglobin A1c     Screening for prostate cancer       Relevant Orders   PSA      Modifiable risk factors discussed with patient. Anticipatory guidance according to age provided. The following topics were also discussed: Social Determinants of Health Smoking Diet and nutrition Benefits of exercise Cancer screening and review of most recent colonoscopy report Vaccinations review and recommendations Cardiovascular risk assessment and need for blood work Mental health including depression and anxiety Fall and accident  prevention  Patient Instructions  Health Maintenance, Male Adopting a healthy lifestyle and getting preventive care are important in promoting health and wellness. Ask your health care provider about: The right schedule for you to have regular tests and exams. Things you can do on your own to prevent diseases and keep yourself healthy. What should I know about diet, weight, and exercise? Eat a healthy diet  Eat a diet that includes plenty of vegetables, fruits, low-fat dairy products, and lean protein. Do not eat a lot of foods that are high in solid fats, added sugars, or sodium. Maintain a healthy weight Body mass index (BMI) is a measurement that can be used to identify possible weight problems. It estimates body fat based on height and weight. Your health care provider can help determine your BMI and help you achieve or maintain a healthy weight. Get regular exercise Get regular exercise. This is one of the most important things you can do for your health. Most adults should: Exercise for at least 150 minutes each week. The exercise should increase your heart rate and make you sweat (moderate-intensity exercise). Do strengthening exercises at least twice a week. This is in addition to the moderate-intensity exercise. Spend less time sitting. Even light physical activity can be beneficial. Watch cholesterol and blood lipids Have your blood tested for lipids and cholesterol at 69 years of age, then have this test every 5 years. You may need to have your cholesterol levels checked more often if: Your lipid or cholesterol levels are high. You are older than 69 years of age. You are at high risk for heart disease. What should I know about  cancer screening? Many types of cancers can be detected early and may often be prevented. Depending on your health history and family history, you may need to have cancer screening at various ages. This may include screening for: Colorectal  cancer. Prostate cancer. Skin cancer. Lung cancer. What should I know about heart disease, diabetes, and high blood pressure? Blood pressure and heart disease High blood pressure causes heart disease and increases the risk of stroke. This is more likely to develop in people who have high blood pressure readings or are overweight. Talk with your health care provider about your target blood pressure readings. Have your blood pressure checked: Every 3-5 years if you are 66-71 years of age. Every year if you are 56 years old or older. If you are between the ages of 67 and 65 and are a current or former smoker, ask your health care provider if you should have a one-time screening for abdominal aortic aneurysm (AAA). Diabetes Have regular diabetes screenings. This checks your fasting blood sugar level. Have the screening done: Once every three years after age 5 if you are at a normal weight and have a low risk for diabetes. More often and at a younger age if you are overweight or have a high risk for diabetes. What should I know about preventing infection? Hepatitis B If you have a higher risk for hepatitis B, you should be screened for this virus. Talk with your health care provider to find out if you are at risk for hepatitis B infection. Hepatitis C Blood testing is recommended for: Everyone born from [birth year] through 1966. Anyone with known risk factors for hepatitis C. Sexually transmitted infections (STIs) You should be screened each year for STIs, including gonorrhea and chlamydia, if: You are sexually active and are younger than 69 years of age. You are older than 69 years of age and your health care provider tells you that you are at risk for this type of infection. Your sexual activity has changed since you were last screened, and you are at increased risk for chlamydia or gonorrhea. Ask your health care provider if you are at risk. Ask your health care provider about whether you are at  high risk for HIV. Your health care provider may recommend a prescription medicine to help prevent HIV infection. If you choose to take medicine to prevent HIV, you should first get tested for HIV. You should then be tested every 3 months for as long as you are taking the medicine. Follow these instructions at home: Alcohol use Do not drink alcohol if your health care provider tells you not to drink. If you drink alcohol: Limit how much you have to 0-2 drinks a day. Know how much alcohol is in your drink. In the [place] one drink equals one 12 oz bottle of beer (355 mL), one 5 oz glass of wine (148 mL), or one 1 oz glass of hard liquor (44 mL). Lifestyle Do not use any products that contain nicotine or tobacco. These products include cigarettes, chewing tobacco, and vaping devices, such as e-cigarettes. If you need help quitting, ask your health care provider. Do not use street drugs. Do not share needles. Ask your health care provider for help if you need support or information about quitting drugs. General instructions Schedule regular health, dental, and eye exams. Stay current with your vaccines. Tell your health care provider if: You often feel depressed. You have ever been abused or do not feel safe at home. Summary Adopting  a healthy lifestyle and getting preventive care are important in promoting health and wellness. Follow your health care provider's instructions about healthy diet, exercising, and getting tested or screened for diseases. Follow your health care provider's instructions on monitoring your cholesterol and blood pressure. This information is not intended to replace advice given to you by your health care provider. Make sure you discuss any questions you have with your health care provider. Document Revised: 02/02/2021 Document Reviewed: 02/02/2021 Elsevier Patient Education  2024 Elsevier Inc.     Emil Schaumann, MD [facility]

## 2024-08-16 NOTE — Assessment & Plan Note (Signed)
 Active and affecting quality of life Most likely musculoskeletal type of injury Differential diagnosis discussed Recommend sports medicine evaluation May benefit from physical therapy May need x-ray Pain management discussed

## 2024-08-16 NOTE — Patient Instructions (Signed)
 Health Maintenance, Male  Adopting a healthy lifestyle and getting preventive care are important in promoting health and wellness. Ask your health care provider about:  The right schedule for you to have regular tests and exams.  Things you can do on your own to prevent diseases and keep yourself healthy.  What should I know about diet, weight, and exercise?  Eat a healthy diet    Eat a diet that includes plenty of vegetables, fruits, low-fat dairy products, and lean protein.  Do not eat a lot of foods that are high in solid fats, added sugars, or sodium.  Maintain a healthy weight  Body mass index (BMI) is a measurement that can be used to identify possible weight problems. It estimates body fat based on height and weight. Your health care provider can help determine your BMI and help you achieve or maintain a healthy weight.  Get regular exercise  Get regular exercise. This is one of the most important things you can do for your health. Most adults should:  Exercise for at least 150 minutes each week. The exercise should increase your heart rate and make you sweat (moderate-intensity exercise).  Do strengthening exercises at least twice a week. This is in addition to the moderate-intensity exercise.  Spend less time sitting. Even light physical activity can be beneficial.  Watch cholesterol and blood lipids  Have your blood tested for lipids and cholesterol at 69 years of age, then have this test every 5 years.  You may need to have your cholesterol levels checked more often if:  Your lipid or cholesterol levels are high.  You are older than 69 years of age.  You are at high risk for heart disease.  What should I know about cancer screening?  Many types of cancers can be detected early and may often be prevented. Depending on your health history and family history, you may need to have cancer screening at various ages. This may include screening for:  Colorectal cancer.  Prostate cancer.  Skin cancer.  Lung  cancer.  What should I know about heart disease, diabetes, and high blood pressure?  Blood pressure and heart disease  High blood pressure causes heart disease and increases the risk of stroke. This is more likely to develop in people who have high blood pressure readings or are overweight.  Talk with your health care provider about your target blood pressure readings.  Have your blood pressure checked:  Every 3-5 years if you are 69-69 years of age.  Every year if you are 3 years old or older.  If you are between the ages of 60 and 72 and are a current or former smoker, ask your health care provider if you should have a one-time screening for abdominal aortic aneurysm (AAA).  Diabetes  Have regular diabetes screenings. This checks your fasting blood sugar level. Have the screening done:  Once every three years after age 69 if you are at a normal weight and have a low risk for diabetes.  More often and at a younger age if you are overweight or have a high risk for diabetes.  What should I know about preventing infection?  Hepatitis B  If you have a higher risk for hepatitis B, you should be screened for this virus. Talk with your health care provider to find out if you are at risk for hepatitis B infection.  Hepatitis C  Blood testing is recommended for:  Everyone born from [birth year] through 1966.  Anyone  with known risk factors for hepatitis C.  Sexually transmitted infections (STIs)  You should be screened each year for STIs, including gonorrhea and chlamydia, if:  You are sexually active and are younger than 69 years of age.  You are older than 69 years of age and your health care provider tells you that you are at risk for this type of infection.  Your sexual activity has changed since you were last screened, and you are at increased risk for chlamydia or gonorrhea. Ask your health care provider if you are at risk.  Ask your health care provider about whether you are at high risk for HIV. Your health care provider  may recommend a prescription medicine to help prevent HIV infection. If you choose to take medicine to prevent HIV, you should first get tested for HIV. You should then be tested every 3 months for as long as you are taking the medicine.  Follow these instructions at home:  Alcohol use  Do not drink alcohol if your health care provider tells you not to drink.  If you drink alcohol:  Limit how much you have to 0-2 drinks a day.  Know how much alcohol is in your drink. In the [place] one drink equals one 12 oz bottle of beer (355 mL), one 5 oz glass of wine (148 mL), or one 1 oz glass of hard liquor (44 mL).  Lifestyle  Do not use any products that contain nicotine or tobacco. These products include cigarettes, chewing tobacco, and vaping devices, such as e-cigarettes. If you need help quitting, ask your health care provider.  Do not use street drugs.  Do not share needles.  Ask your health care provider for help if you need support or information about quitting drugs.  General instructions  Schedule regular health, dental, and eye exams.  Stay current with your vaccines.  Tell your health care provider if:  You often feel depressed.  You have ever been abused or do not feel safe at home.  Summary  Adopting a healthy lifestyle and getting preventive care are important in promoting health and wellness.  Follow your health care provider's instructions about healthy diet, exercising, and getting tested or screened for diseases.  Follow your health care provider's instructions on monitoring your cholesterol and blood pressure.  This information is not intended to replace advice given to you by your health care provider. Make sure you discuss any questions you have with your health care provider.  Document Revised: 02/02/2021 Document Reviewed: 02/02/2021  Elsevier Patient Education  2024 [employer].

## 2024-08-17 ENCOUNTER — Ambulatory Visit: Payer: Self-pay | Admitting: Emergency Medicine

## 2024-08-27 NOTE — Telephone Encounter (Signed)
No concerns at all.

## 2024-08-27 NOTE — Telephone Encounter (Signed)
 Any concerns here with this being elevated?

## 2024-08-27 NOTE — Progress Notes (Unsigned)
 Ben Sabrina Arriaga D.[facility]MENT[facility]EN [facility] [address] [place] [address] Phone: [phone]   Assessment and Plan:     1. Left hip pain (Primary) 2. Primary osteoarthritis of left hip - Chronic with exacerbation, initial visit - Consistent with flare of osteoarthritis of left femoral acetabular joint - X-ray obtained in clinic.  My interpretation: No acute fracture or dislocation.  Moderate to severe degenerative changes in left femoral acetabular joint with sclerosis, decreased joint space, bone spurs, femoral head cystic changes.  Mild to moderate degenerative changes in right femoral acetabular joint - Use meloxicam  15 mg daily as needed for breakthrough pain.  Recommend limiting chronic NSAIDs to 1-2 doses per week to prevent long-term side effects. Use Tylenol  500 to 1000 mg tablets 2-3 times a day as needed for day-to-day pain relief.    -Start HEP for hip  15 additional minutes spent for educating Therapeutic Home Exercise Program.  This included exercises focusing on stretching, strengthening, with focus on eccentric aspects.   Long term goals include an improvement in range of motion, strength, endurance as well as avoiding reinjury. Patient's frequency would include in 1-2 times a day, 3-5 times a week for a duration of 6-12 weeks. Proper technique shown and discussed handout in great detail with ATC.  All questions were discussed and answered.    Pertinent previous records reviewed include none   Follow Up: As needed if no improvement or worsening of symptoms.  Would consider meloxicam  course daily versus prednisone  course versus CSI versus physical therapy   Subjective:   I, Taylor Morton am a scribe for Dr. Leonce.   Chief Complaint: hip pain   HPI:   08/28/24 Patient is a 68 year old male presenting to [facility] for hip pain. Patient referred to sports medicine by Dr. Purcell on 08/16/24 where patient states that the left  hip pain has been going on for several weeks and the pain radiates down the left leg (sharp shooting) to heel of left foot. It doesn't stop him from doing anything but at the end of the day it starts to hurt. This has been an issue since February 2025.   Hip pain  Located:   Side: Left  Mechanical symptoms: Radiates: yes down leg to heel  Aggravates: laying down to sleep Treatments tried: Ibuprofen,    Relevant Historical Information: None pertinent  Additional pertinent review of systems negative.   Current Outpatient Medications:    alfuzosin (UROXATRAL) 10 MG 24 hr tablet, Take 10 mg by mouth daily., Disp: , Rfl:    finasteride (PROSCAR) 5 MG tablet, Take 5 mg by mouth daily., Disp: , Rfl:    tadalafil  (CIALIS ) 20 MG tablet, Take 0.5 tablets (10 mg total) by mouth daily as needed for erectile dysfunction., Disp: 10 tablet, Rfl: 5   Objective:     Vitals:   08/28/24 1438  BP: (!) 150/70  Pulse: 77  SpO2: 97%  Weight: 182 lb (82.6 kg)  Height: 6' 1 (1.854 m)      Body mass index is 24.01 kg/m.    Physical Exam:    General: awake, alert, and oriented no acute distress, nontoxic Skin: no suspicious lesions or rashes Neuro:sensation intact distally with no deficits, normal muscle tone, no atrophy, strength 5/5 in all tested lower ext groups Psych: normal mood and affect, speech clear   Right hip: No deformity, swelling or wasting ROM Flexion 90, ext 30, IR 35, ER 40 NTTP over  the hip flexors, greater trochanter, gluteal musculature, si joint, lumbar spine Positive log roll with FROM Positive FABER Positive FADIR Negative Piriformis test Negative trendelenberg Gait normal    Electronically signed by:  Odis Mace D.[facility] 3:01 PM 08/28/24

## 2024-08-28 ENCOUNTER — Ambulatory Visit

## 2024-08-28 ENCOUNTER — Ambulatory Visit: Admitting: Sports Medicine

## 2024-08-28 VITALS — BP 150/70 | HR 77 | Ht 73.0 in | Wt 182.0 lb

## 2024-08-28 DIAGNOSIS — M1612 Unilateral primary osteoarthritis, left hip: Secondary | ICD-10-CM | POA: Diagnosis not present

## 2024-08-28 DIAGNOSIS — M160 Bilateral primary osteoarthritis of hip: Secondary | ICD-10-CM | POA: Diagnosis not present

## 2024-08-28 DIAGNOSIS — M25552 Pain in left hip: Secondary | ICD-10-CM | POA: Diagnosis not present

## 2024-08-28 MED ORDER — MELOXICAM 15 MG PO TABS: 15.0000 mg | ORAL_TABLET | ORAL | 0 refills | Status: AC | PRN

## 2024-08-28 NOTE — Patient Instructions (Addendum)
-   Use meloxicam  15 mg daily as needed for breakthrough pain.  Recommend limiting chronic NSAIDs to 1-2 doses per week to prevent long-term side effects. Use Tylenol  500 to 1000 mg tablets 2-3 times a day as needed for day-to-day pain relief.    Hip HEP. Follow up as needed.

## 2024-09-04 ENCOUNTER — Ambulatory Visit: Payer: Self-pay | Admitting: Sports Medicine

## 2024-09-17 ENCOUNTER — Ambulatory Visit: Payer: Medicare PPO

## 2024-09-17 VITALS — Ht 72.0 in | Wt 162.0 lb

## 2024-09-17 DIAGNOSIS — Z0000 Encounter for general adult medical examination without abnormal findings: Secondary | ICD-10-CM | POA: Diagnosis not present

## 2024-09-17 NOTE — Patient Instructions (Signed)
 Mr. Taylor Morton,  Thank you for taking the time for your Medicare Wellness Visit. I appreciate your continued commitment to your health goals. Please review the care plan we discussed, and feel free to reach out if I can assist you further.  Please note that Annual Wellness Visits do not include a physical exam. Some assessments may be limited, especially if the visit was conducted virtually. If needed, we may recommend an in-person follow-up with your provider.  Ongoing Care Seeing your primary care provider every 3 to 6 months helps us  monitor your health and provide consistent, personalized care.   Referrals If a referral was made during today's visit and you haven't received any updates within two weeks, please contact the referred provider directly to check on the status.  Recommended Screenings:  Health Maintenance  Topic Date Due   Pneumococcal Vaccine for age over 57 (1 of 2 - PCV) Never done   Zoster (Shingles) Vaccine (1 of 2) Never done   COVID-19 Vaccine (6 - 2025-26 season) 05/28/2024   Flu Shot  12/25/2024*   Medicare Annual Wellness Visit  09/17/2025   Colon Cancer Screening  03/03/2030   Hepatitis C Screening  Completed   Meningitis B Vaccine  Aged Out   DTaP/Tdap/Td vaccine  Discontinued  *Topic was postponed. The date shown is not the original due date.       09/17/2024    3:27 PM  Advanced Directives  Does Patient Have a Medical Advance Directive? No  Would patient like information on creating a medical advance directive? No - Patient declined    Vision: Annual vision screenings are recommended for early detection of glaucoma, cataracts, and diabetic retinopathy. These exams can also reveal signs of chronic conditions such as diabetes and high blood pressure.  Dental: Annual dental screenings help detect early signs of oral cancer, gum disease, and other conditions linked to overall health, including heart disease and diabetes.

## 2024-09-17 NOTE — Progress Notes (Signed)
 "  Chief Complaint  Patient presents with   Medicare Wellness     Subjective:  Please attest and cosign this visit due to patients primary care provider not being in the office at the time the visit was completed.  (Pt of Dr CHRISTELLA. Purcell)   Taylor Morton is a 68 y.o. male who presents for a Medicare Annual Wellness Visit.  Visit info / Clinical Intake: Medicare Wellness Visit Type:: Subsequent Annual Wellness Visit Persons participating in visit and providing information:: patient Medicare Wellness Visit Mode:: Telephone If telephone:: video declined Since this visit was completed virtually, some vitals may be partially provided or unavailable. Missing vitals are due to the limitations of the virtual format.: Documented vitals are patient reported If Telephone or Video please confirm:: I connected with patient using audio/video enable telemedicine. I verified patient identity with two identifiers, discussed telehealth limitations, and patient agreed to proceed. Patient Location:: Home Provider Location:: Office Interpreter Needed?: No Pre-visit prep was completed: yes AWV questionnaire completed by patient prior to visit?: no Living arrangements:: lives with spouse/significant other Patient's Overall Health Status Rating: good Typical amount of pain: none Does pain affect daily life?: no Are you currently prescribed opioids?: no  Dietary Habits and Nutritional Risks How many meals a day?: 3 Eats fruit and vegetables daily?: yes Most meals are obtained by: preparing own meals; eating out In the last 2 weeks, have you had any of the following?: unintentional weight loss (lost 20lbs pt stated) Diabetic:: no  Functional Status Activities of Daily Living (to include ambulation/medication): Independent Ambulation: Independent with device- listed below Home Assistive Devices/Equipment: Eyeglasses Medication Administration: Independent Home Management (perform basic housework or  laundry): Independent Manage your own finances?: yes Primary transportation is: driving Concerns about vision?: no *vision screening is required for WTM* Concerns about hearing?: no  Fall Screening Falls in the past year?: 0 Number of falls in past year: 0 Was there an injury with Fall?: 0 Fall Risk Category Calculator: 0 Patient Fall Risk Level: Low Fall Risk  Fall Risk Patient at Risk for Falls Due to: No Fall Risks Fall risk Follow up: Falls evaluation completed; Falls prevention discussed  Home and Transportation Safety: All rugs have non-skid backing?: N/A, no rugs All stairs or steps have railings?: yes Grab bars in the bathtub or shower?: (!) no Have non-skid surface in bathtub or shower?: yes Good home lighting?: yes Regular seat belt use?: yes Hospital stays in the last year:: no  Cognitive Assessment Difficulty concentrating, remembering, or making decisions? : no Will 6CIT or Mini Cog be Completed: yes What year is it?: 0 points What month is it?: 0 points Give patient an address phrase to remember (5 components): [address] [place], [place] About what time is it?: 0 points Count backwards from 20 to 1: 0 points Say the months of the year in reverse: 0 points Repeat the address phrase from earlier: 0 points 6 CIT Score: 0 points  Advance Directives (For Healthcare) Does Patient Have a Medical Advance Directive?: No Would patient like information on creating a medical advance directive?: No - Patient declined  Reviewed/Updated  Reviewed/Updated: Reviewed All (Medical, Surgical, Family, Medications, Allergies, Care Teams, Patient Goals)    Allergies (verified) Dust mite extract   Current Medications (verified) Outpatient Encounter Medications as of 09/17/2024  Medication Sig   alfuzosin (UROXATRAL) 10 MG 24 hr tablet Take 10 mg by mouth daily.   finasteride (PROSCAR) 5 MG tablet Take 5 mg by mouth daily.  meloxicam  (MOBIC ) 15 MG tablet Take 1 tablet  (15 mg total) by mouth as needed for pain.   tadalafil  (CIALIS ) 20 MG tablet Take 0.5 tablets (10 mg total) by mouth daily as needed for erectile dysfunction.   No facility-administered encounter medications on file as of 09/17/2024.    History: Past Medical History:  Diagnosis Date   Asthma    caused by dust - no inhaler   Bradycardia    Chest pain    no current problems   COPD (chronic obstructive pulmonary disease) (HCC)    Past Surgical History:  Procedure Laterality Date   APPENDECTOMY     Family History  Problem Relation Age of Onset   Cancer Father    Arthritis Mother    Colon cancer Neg Hx    Rectal cancer Neg Hx    Stomach cancer Neg Hx    Social History   Occupational History   Occupation: Advertising Account Executive: [employer]    Comment: Earth Engineer, [employer]  Tobacco Use   Smoking status: Former    Current packs/day: 0.00    Average packs/day: 1 pack/day for 10.0 years (10.0 ttl pk-yrs)    Types: Cigarettes    Start date: 61    Quit date: 1992    Years since quitting: 33.9   Smokeless tobacco: Never  Vaping Use   Vaping status: Never Used  Substance and Sexual Activity   Alcohol use: Not Currently   Drug use: No    Types: Marijuana   Sexual activity: Yes   Tobacco Counseling Counseling given: Yes  SDOH Screenings   Food Insecurity: No Food Insecurity (09/17/2024)  Housing: Unknown (09/17/2024)  Transportation Needs: No Transportation Needs (09/17/2024)  Utilities: Not At Risk (09/17/2024)  Alcohol Screen: Low Risk (08/05/2024)  Depression (PHQ2-9): Low Risk (09/17/2024)  Financial Resource Strain: Low Risk (08/05/2024)  Physical Activity: Sufficiently Active (09/17/2024)  Social Connections: Socially Integrated (09/17/2024)  Stress: No Stress Concern Present (09/17/2024)  Tobacco Use: Medium Risk (09/17/2024)  Health Literacy: Adequate Health Literacy (09/17/2024)   See flowsheets for full screening  details  Depression Screen PHQ 2 & 9 Depression Scale- Over the past 2 weeks, how often have you been bothered by any of the following problems? Little interest or pleasure in doing things: 0 Feeling down, depressed, or hopeless (PHQ Adolescent also includes...irritable): 0 PHQ-2 Total Score: 0 Trouble falling or staying asleep, or sleeping too much: 0 Feeling tired or having little energy: 0 Poor appetite or overeating (PHQ Adolescent also includes...weight loss): 0 Feeling bad about yourself - or that you are a failure or have let yourself or your family down: 0 Trouble concentrating on things, such as reading the newspaper or watching television (PHQ Adolescent also includes...like school work): 0 Moving or speaking so slowly that other people could have noticed. Or the opposite - being so fidgety or restless that you have been moving around a lot more than usual: 0 Thoughts that you would be better off dead, or of hurting yourself in some way: 0 PHQ-9 Total Score: 0 If you checked off any problems, how difficult have these problems made it for you to do your work, take care of things at home, or get along with other people?: Not difficult at all  Depression Treatment Depression Interventions/Treatment : EYV7-0 Score <4 Follow-up Not Indicated     Goals Addressed               This  Visit's Progress     Patient Stated (pt-stated)        Patient stated he plans to continue being active - has lost weight and managing diet             Objective:    Today's Vitals   09/17/24 1523  Weight: 162 lb (73.5 kg)  Height: 6' (1.829 m)   Body mass index is 21.97 kg/m.  Hearing/Vision screen Hearing Screening - Comments:: Denies hearing difficulties   Vision Screening - Comments:: Wears rx glasses - up to date with routine eye exams with Adine Haddock Immunizations and Health Maintenance Health Maintenance  Topic Date Due   Pneumococcal Vaccine: 50+ Years (1 of 2 - PCV)  Never done   Zoster Vaccines- Shingrix (1 of 2) Never done   COVID-19 Vaccine (6 - 2025-26 season) 05/28/2024   Medicare Annual Wellness (AWV)  09/17/2025   Colonoscopy  03/03/2030   Influenza Vaccine  Completed   Hepatitis C Screening  Completed   Meningococcal B Vaccine  Aged Out   DTaP/Tdap/Td  Discontinued        Assessment/Plan:  This is a routine wellness examination for Taylor Morton.  Patient Care Team: Purcell Emil Schanz, MD as PCP - General (Internal Medicine) Haddock Adine, MD as Consulting Physician (Ophthalmology)  I have personally reviewed and noted the following in the patients chart:   Medical and social history Use of alcohol, tobacco or illicit drugs  Current medications and supplements including opioid prescriptions. Functional ability and status Nutritional status Physical activity Advanced directives List of other physicians Hospitalizations, surgeries, and ER visits in previous 12 months Vitals Screenings to include cognitive, depression, and falls Referrals and appointments  No orders of the defined types were placed in this encounter.  In addition, I have reviewed and discussed with patient certain preventive protocols, quality metrics, and best practice recommendations. A written personalized care plan for preventive services as well as general preventive health recommendations were provided to patient.   Verdie CHRISTELLA Saba, CMA   09/17/2024   Return in 1 year (on 09/17/2025).  After Visit Summary: (MyChart) Due to this being a telephonic visit, the after visit summary with patients personalized plan was offered to patient via MyChart   Nurse Notes: scheduled an appt w/PCP for 09/25/24 to address wt loss/coughing and fam hx of Lung Cancer.  Pt would like to discuss having a chest x-ray/CT Scan (former smoker). "

## 2024-09-25 ENCOUNTER — Ambulatory Visit: Admitting: Emergency Medicine

## 2024-09-25 ENCOUNTER — Encounter: Payer: Self-pay | Admitting: Emergency Medicine

## 2024-09-25 VITALS — BP 120/80 | HR 80 | Temp 98.0°F | Ht 72.0 in | Wt 171.0 lb

## 2024-09-25 DIAGNOSIS — R053 Chronic cough: Secondary | ICD-10-CM | POA: Diagnosis not present

## 2024-09-25 DIAGNOSIS — Z801 Family history of malignant neoplasm of trachea, bronchus and lung: Secondary | ICD-10-CM | POA: Insufficient documentation

## 2024-09-25 DIAGNOSIS — Z122 Encounter for screening for malignant neoplasm of respiratory organs: Secondary | ICD-10-CM

## 2024-09-25 DIAGNOSIS — F17200 Nicotine dependence, unspecified, uncomplicated: Secondary | ICD-10-CM | POA: Diagnosis not present

## 2024-09-25 NOTE — Assessment & Plan Note (Signed)
 Chest x-ray report from last February reviewed.  It showed no concerns at the time. Chronic smoker with family history of lung cancer Recommend screening for lung cancer with CT chest Referral placed today.

## 2024-09-25 NOTE — Patient Instructions (Signed)
 Lung Cancer Screening A lung cancer screening is a test that checks for lung cancer when there are no symptoms or history of that disease. The screening is done to look for lung cancer in its very early stages. Finding cancer early improves the chances of successful treatment. It may save your life. Who should have a screening? You should be screened for lung cancer if all of these apply: You currently smoke or you used to smoke. You are between the ages of 51 and 31 years old. Screening may be recommended up to age 58 depending on your overall health and other factors. You have a smoking history of 1 pack of cigarettes a day for 20 years or 2 packs a day for 10 years. How is screening done?  The recommended screening test is a low-dose computed tomography (LDCT) scan. This scan takes detailed images of the lungs. This allows a health care provider to look for abnormal cells. If you are at risk for lung cancer, it is recommended that you get screened once a year. Talk to your health care provider about the risks, benefits, and limitations of screening. What are the benefits of screening? Screening can find lung cancer early, before symptoms start and before it has spread outside of the lungs. The chances of curing lung cancer are greater if the cancer is diagnosed early. What are the risks of screening? The screening may show lung cancer when no cancer is present. Talk with your health care provider about what your results mean. In some cases, your health care provider may do more testing to confirm the results. The screening may not find lung cancer when it is present. You will be exposed to radiation from repeated LDCT tests, which can cause cancer in otherwise healthy people. How can I lower my risk of lung cancer? Make these lifestyle changes to lower your risk of developing lung cancer: Do not use any products that contain nicotine or tobacco. These products include cigarettes, chewing  tobacco, and vaping devices, such as e-cigarettes. If you need help quitting, ask your health care provider. Avoid secondhand smoke. Avoid exposure to radiation. Avoid exposure to radon gas. Have your home checked for radon regularly. Avoid things that cause cancer (carcinogens). Avoid living or working in places with high air pollution or diesel exhaust. Questions to ask your health care provider Am I eligible for lung cancer screening? Does my health insurance cover the cost of lung cancer screening? What happens if the lung cancer screening shows something of concern? How soon will I have results from my lung cancer screening? Is there anything that I need to do to prepare for my lung cancer screening? What happens if I decide not to have lung cancer screening? Where to find more information Ask your health care provider about the risks and benefits of screening. More information and resources are available from these organizations: American Cancer Society (ACS): cancer.org American Lung Association: lung.org National Cancer Institute: cancer.gov Contact a health care provider if: You start to show symptoms of lung cancer, including: A cough that will not go away. High-pitched whistling sounds when you breathe, most often when you breathe out (wheezing). Chest pain. Coughing up blood. Shortness of breath. Weight loss that cannot be explained. Constant tiredness (fatigue). Hoarse voice. Summary Lung cancer screening may find lung cancer before symptoms appear. Finding cancer early improves the chances of successful treatment. It may save your life. The recommended screening test is a low-dose computed tomography (LDCT) scan that  looks for abnormal cells in the lungs. If you are at risk for lung cancer, it is recommended that you get screened once a year. You can make lifestyle changes to lower your risk of lung cancer. Ask your health care provider about the risks and benefits of  screening. This information is not intended to replace advice given to you by your health care provider. Make sure you discuss any questions you have with your health care provider. Document Revised: 09/21/2022 Document Reviewed: 03/04/2021 Elsevier Patient Education  2024 [employer].

## 2024-09-25 NOTE — Assessment & Plan Note (Signed)
 Recommend screening for lung cancer

## 2024-09-25 NOTE — Assessment & Plan Note (Signed)
Cardiovascular/cancer risks associated with smoking discussed. Smoking cessation advice given.

## 2024-09-25 NOTE — Progress Notes (Signed)
 Taylor Morton 68 y.o.   Chief Complaint  Patient presents with   Cough    Pt has family history with lung cancer and wants to discus about this and has concerns and questions     HISTORY OF PRESENT ILLNESS: This is a 68 y.o. male current smoker interested in lung cancer screening Family history of lung cancer Chronic cough Denies hemoptysis.  Denies difficulty breathing. No chest pain, fever, chills or any other associated symptoms No other complaints or medical concerns today.  Cough Pertinent negatives include no chest pain, chills, fever, headaches, rash, sore throat or shortness of breath.     Prior to Admission medications  Medication Sig Start Date End Date Taking? Authorizing Provider  alfuzosin (UROXATRAL) 10 MG 24 hr tablet Take 10 mg by mouth daily. 08/21/22  Yes [provider]  finasteride (PROSCAR) 5 MG tablet Take 5 mg by mouth daily. 12/30/21  Yes [provider]  meloxicam  (MOBIC ) 15 MG tablet Take 1 tablet (15 mg total) by mouth as needed for pain. 08/28/24  Yes Leonce Katz, DO  tadalafil  (CIALIS ) 20 MG tablet Take 0.5 tablets (10 mg total) by mouth daily as needed for erectile dysfunction. 09/17/22  Yes Purcell Emil Schanz, MD    Allergies[1]  Patient Active Problem List   Diagnosis Date Noted   Chronic left hip pain 08/16/2024   Current smoker 11/07/2023   Benign prostatic hyperplasia without lower urinary tract symptoms 05/02/2023   History of BPH 04/28/2022   Coronary artery calcification 02/28/2017   Asthma     Past Medical History:  Diagnosis Date   Asthma    caused by dust - no inhaler   Bradycardia    Chest pain    no current problems   COPD (chronic obstructive pulmonary disease) (HCC)     Past Surgical History:  Procedure Laterality Date   APPENDECTOMY      Social History   Socioeconomic History   Marital status: Married    Spouse name: Lonell   Number of children: 2   Years of education: 16   Highest  education level: Bachelor's degree (e.g., BA, AB, BS)  Occupational History   Occupation: Advertising Account Executive: [employer]    Comment: Earth Engineer, Water Issues  Tobacco Use   Smoking status: Former    Current packs/day: 0.00    Average packs/day: 1 pack/day for 10.0 years (10.0 ttl pk-yrs)    Types: Cigarettes    Start date: 49    Quit date: 1992    Years since quitting: 34.0   Smokeless tobacco: Never  Vaping Use   Vaping status: Never Used  Substance and Sexual Activity   Alcohol use: Not Currently   Drug use: No    Types: Marijuana   Sexual activity: Yes  Other Topics Concern   Not on file  Social History Narrative   Lives with his wife.  Sons are both in college.   Social Drivers of Health   Tobacco Use: Medium Risk (09/25/2024)   Patient History    Smoking Tobacco Use: Former    Smokeless Tobacco Use: Never    Passive Exposure: Not on file  Financial Resource Strain: Low Risk (08/05/2024)   Overall Financial Resource Strain (CARDIA)    Difficulty of Paying Living Expenses: Not hard at all  Food Insecurity: No Food Insecurity (09/17/2024)   Epic    Worried About Running Out of Food in the Last Year: Never true  Ran Out of Food in the Last Year: Never true  Transportation Needs: No Transportation Needs (09/17/2024)   Epic    Lack of Transportation (Medical): No    Lack of Transportation (Non-Medical): No  Physical Activity: Sufficiently Active (09/17/2024)   Exercise Vital Sign    Days of Exercise per Week: 7 days    Minutes of Exercise per Session: 150+ min  Stress: No Stress Concern Present (09/17/2024)   [employer] of Occupational Health - Occupational Stress Questionnaire    Feeling of Stress: Not at all  Social Connections: Socially Integrated (09/17/2024)   Social Connection and Isolation Panel    Frequency of Communication with Friends and Family: More than three times a week    Frequency of Social Gatherings  with Friends and Family: Three times a week    Attends Religious Services: 1 to 4 times per year    Active Member of Clubs or Organizations: Yes    Attends Banker Meetings: More than 4 times per year    Marital Status: Married  Catering Manager Violence: Not At Risk (09/17/2024)   Epic    Fear of Current or Ex-Partner: No    Emotionally Abused: No    Physically Abused: No    Sexually Abused: No  Depression (PHQ2-9): Low Risk (09/17/2024)   Depression (PHQ2-9)    PHQ-2 Score: 0  Alcohol Screen: Low Risk (08/05/2024)   Alcohol Screen    Last Alcohol Screening Score (AUDIT): 2  Housing: Unknown (09/17/2024)   Epic    Unable to Pay for Housing in the Last Year: No    Number of Times Moved in the Last Year: Not on file    Homeless in the Last Year: No  Utilities: Not At Risk (09/17/2024)   Epic    Threatened with loss of utilities: No  Health Literacy: Adequate Health Literacy (09/17/2024)   B1300 Health Literacy    Frequency of need for help with medical instructions: Never    Family History  Problem Relation Age of Onset   Cancer Father    Arthritis Mother    Colon cancer Neg Hx    Rectal cancer Neg Hx    Stomach cancer Neg Hx      Review of Systems  Constitutional: Negative.  Negative for chills and fever.  HENT: Negative.  Negative for congestion and sore throat.   Respiratory:  Positive for cough. Negative for shortness of breath.   Cardiovascular: Negative.  Negative for chest pain and palpitations.  Gastrointestinal:  Negative for abdominal pain, diarrhea, nausea and vomiting.  Genitourinary: Negative.  Negative for dysuria and hematuria.  Skin: Negative.  Negative for rash.  Neurological: Negative.  Negative for dizziness and headaches.  All other systems reviewed and are negative.   Vitals:   09/25/24 1042  BP: 120/80  Pulse: 80  Temp: 98 F (36.7 C)  SpO2: 98%    Physical Exam Vitals reviewed.  Constitutional:      Appearance: Normal  appearance.  HENT:     Head: Normocephalic.  Eyes:     Extraocular Movements: Extraocular movements intact.  Cardiovascular:     Rate and Rhythm: Normal rate.  Pulmonary:     Effort: Pulmonary effort is normal.  Skin:    General: Skin is warm and dry.  Neurological:     Mental Status: He is alert and oriented to person, place, and time.  Psychiatric:        Mood and Affect: Mood normal.  Behavior: Behavior normal.      ASSESSMENT & PLAN: Problem List Items Addressed This Visit       Other   Current smoker   Cardiovascular-cancer risks associated with smoking discussed Smoking cessation advice given      Relevant Orders   Ambulatory Referral Lung Cancer Screening [facility]   Chronic cough - Primary   Chest x-ray report from last February reviewed.  It showed no concerns at the time. Chronic smoker with family history of lung cancer Recommend screening for lung cancer with CT chest Referral placed today.      Relevant Orders   Ambulatory Referral Lung Cancer Screening [facility]   Family history of lung cancer   Recommend screening for lung cancer      Relevant Orders   Ambulatory Referral Lung Cancer Screening [facility]   Other Visit Diagnoses       Screening for lung cancer       Relevant Orders   Ambulatory Referral Lung Cancer Screening [facility]      Patient Instructions  Lung Cancer Screening A lung cancer screening is a test that checks for lung cancer when there are no symptoms or history of that disease. The screening is done to look for lung cancer in its very early stages. Finding cancer early improves the chances of successful treatment. It may save your life. Who should have a screening? You should be screened for lung cancer if all of these apply: You currently smoke or you used to smoke. You are between the ages of 60 and 74 years old. Screening may be recommended up to age 109 depending on your overall  health and other factors. You have a smoking history of 1 pack of cigarettes a day for 20 years or 2 packs a day for 10 years. How is screening done?  The recommended screening test is a low-dose computed tomography (LDCT) scan. This scan takes detailed images of the lungs. This allows a health care provider to look for abnormal cells. If you are at risk for lung cancer, it is recommended that you get screened once a year. Talk to your health care provider about the risks, benefits, and limitations of screening. What are the benefits of screening? Screening can find lung cancer early, before symptoms start and before it has spread outside of the lungs. The chances of curing lung cancer are greater if the cancer is diagnosed early. What are the risks of screening? The screening may show lung cancer when no cancer is present. Talk with your health care provider about what your results mean. In some cases, your health care provider may do more testing to confirm the results. The screening may not find lung cancer when it is present. You will be exposed to radiation from repeated LDCT tests, which can cause cancer in otherwise healthy people. How can I lower my risk of lung cancer? Make these lifestyle changes to lower your risk of developing lung cancer: Do not use any products that contain nicotine or tobacco. These products include cigarettes, chewing tobacco, and vaping devices, such as e-cigarettes. If you need help quitting, ask your health care provider. Avoid secondhand smoke. Avoid exposure to radiation. Avoid exposure to radon gas. Have your home checked for radon regularly. Avoid things that cause cancer (carcinogens). Avoid living or working in places with high air pollution or diesel exhaust. Questions to ask your health care provider Am I eligible for lung cancer screening? Does my health insurance cover  the cost of lung cancer screening? What happens if the lung cancer screening  shows something of concern? How soon will I have results from my lung cancer screening? Is there anything that I need to do to prepare for my lung cancer screening? What happens if I decide not to have lung cancer screening? Where to find more information Ask your health care provider about the risks and benefits of screening. More information and resources are available from these organizations: American Cancer Society (ACS): cancer.org American Lung Association: lung.org National Cancer Institute: cancer.gov Contact a health care provider if: You start to show symptoms of lung cancer, including: A cough that will not go away. High-pitched whistling sounds when you breathe, most often when you breathe out (wheezing). Chest pain. Coughing up blood. Shortness of breath. Weight loss that cannot be explained. Constant tiredness (fatigue). Hoarse voice. Summary Lung cancer screening may find lung cancer before symptoms appear. Finding cancer early improves the chances of successful treatment. It may save your life. The recommended screening test is a low-dose computed tomography (LDCT) scan that looks for abnormal cells in the lungs. If you are at risk for lung cancer, it is recommended that you get screened once a year. You can make lifestyle changes to lower your risk of lung cancer. Ask your health care provider about the risks and benefits of screening. This information is not intended to replace advice given to you by your health care provider. Make sure you discuss any questions you have with your health care provider. Document Revised: 09/21/2022 Document Reviewed: 03/04/2021 Elsevier Patient Education  2024 Elsevier Inc.    Emil Schaumann, MD [facility]    [1]  Allergies Allergen Reactions   Dust Mite Extract     cough

## 2024-10-02 ENCOUNTER — Telehealth: Payer: Self-pay

## 2024-10-02 NOTE — Telephone Encounter (Signed)
 I was told to refer to lung cancer screening clinic and they make this decision and place orders as required.

## 2024-10-02 NOTE — Telephone Encounter (Signed)
 I have discussed this with provider face to face. Patient is still continuously smoking but is not informing them

## 2024-10-02 NOTE — Telephone Encounter (Signed)
 Please advise

## 2024-10-02 NOTE — Telephone Encounter (Signed)
 Copied from CRM [id number]. Topic: Referral - Question >> Oct 02, 2024  2:48 PM Robinson H wrote: Reason for CRM: Patient was referred for a lung screening and was advised that insurance won't cover, states office where referral was sent was supposed to reach out to Dr. Purcell to have patient have another test with less radiation with chest view.  Susana  [phone]

## 2024-10-02 NOTE — Telephone Encounter (Signed)
 Patient is referring to the referral that was placed. They denied him the screening due to him quit smoking along time ago and is needing for you to request a chest screening instead

## 2024-10-02 NOTE — Telephone Encounter (Signed)
 Do not know what place or test this is.  Please find out.

## 2024-10-03 ENCOUNTER — Encounter: Payer: Self-pay | Admitting: Urology

## 2024-10-03 ENCOUNTER — Other Ambulatory Visit: Payer: Self-pay | Admitting: Urology

## 2024-10-03 DIAGNOSIS — R972 Elevated prostate specific antigen [PSA]: Secondary | ICD-10-CM

## 2024-10-16 ENCOUNTER — Ambulatory Visit: Admitting: Orthopedic Surgery

## 2024-10-16 ENCOUNTER — Other Ambulatory Visit: Payer: Self-pay

## 2024-10-16 DIAGNOSIS — M21612 Bunion of left foot: Secondary | ICD-10-CM

## 2024-10-16 DIAGNOSIS — M21611 Bunion of right foot: Secondary | ICD-10-CM

## 2024-10-16 DIAGNOSIS — M79671 Pain in right foot: Secondary | ICD-10-CM | POA: Diagnosis not present

## 2024-10-16 DIAGNOSIS — M2022 Hallux rigidus, left foot: Secondary | ICD-10-CM | POA: Diagnosis not present

## 2024-10-16 DIAGNOSIS — M2021 Hallux rigidus, right foot: Secondary | ICD-10-CM

## 2024-10-17 ENCOUNTER — Ambulatory Visit: Payer: Self-pay

## 2024-10-17 LAB — URIC ACID: Uric Acid, Serum: 5.1 mg/dL (ref 4.0–8.0)

## 2024-10-17 NOTE — Telephone Encounter (Signed)
Called and lm on vm to advise of lab results. To call with any questions.

## 2024-10-17 NOTE — Telephone Encounter (Signed)
-----   Message from Jerona Sage, MD sent at 10/17/2024  6:38 AM EST ----- Call patient.  Uric acid in the normal range of 5.1.

## 2024-10-18 ENCOUNTER — Encounter: Payer: Self-pay | Admitting: Orthopedic Surgery

## 2024-10-18 NOTE — Progress Notes (Signed)
 "  Office Visit Note   Patient: Taylor Morton           Date of Birth: 12/18/1955           MRN: [id number] Visit Date: 10/16/2024              Requested by: Purcell Emil Schanz, MD [address] [place],  [place] [address] PCP: Purcell Emil Schanz, MD  Chief Complaint  Patient presents with   Right Foot - Pain      HPI: Discussed the use of AI scribe software for clinical note transcription with the patient, who gave verbal consent to proceed.  History of Present Illness Demetrick Eichenberger is a 69 year old male with longstanding bilateral hallux rigidus and bunion deformities who presents with worsening right great toe pain and swelling following trauma.  He has experienced bilateral great toe pain and stiffness for approximately 30 years, attributed to hallux rigidus and bunion deformities, with the right side being more symptomatic. He describes chronic pain, swelling, and significant limitation in joint movement, with the right great toe exhibiting more severe symptoms than the left. Pain is persistent, interfering with ambulation and daily activities.  Several weeks prior to presentation, he sustained an acute injury to the right foot after dropping a full water bottle on the right great toe while barefoot. This resulted in acute swelling, increased pain, and inflammation localized to the right great toe. The pain was severe enough to restrict ambulation to essential activities. He managed the episode with ice and soaking, which led to partial improvement in swelling, but ongoing pain and stiffness persist.  He has attempted various conservative measures over the years, including cushioned shoes, tight socks, hiking shoes, and orthotics. He currently uses a device to prevent toe flexion and elevate the toes, as well as plush, cushioned footwear for comfort. He has not pursued injections for pain relief. He remains physically active, continuing to walk and hike, but adjusts his footwear to  manage symptoms.  He has not undergone prior workup or imaging for these symptoms.  He denies a personal history of gout, though his father had a history of alcohol use and high red meat intake. He notes increased sugar intake over the holidays but has not experienced symptoms suggestive of gout.     Assessment & Plan: Visit Diagnoses:  1. Bilateral bunions   2. Pain in right foot     Plan: Assessment and Plan Assessment & Plan Bilateral hallux rigidus with bunions Chronic, severe bilateral hallux rigidus with complete collapse of both first MTP joints and large osteophytic spurs, worse on the right. Significant pain and functional limitation. Definitive treatment is surgical fusion of the MTP joint, eliminating pain but causing permanent joint rigidity. Nonoperative management may relieve symptoms. - Ordered bilateral foot radiographs. - Recommended trial of 1.2 mm thick carbon fiber insoles. - Advised continued use of supportive footwear and orthotics. - Discussed surgical option of MTP joint fusion if conservative measures fail. - Ordered blood work to evaluate for possible gout or other contributing factors. - Advised follow-up to assess response to insoles.  Pressure ulceration between right great toe and second toe Pressure ulceration secondary to impingement from hallux valgus deformity and MTP joint collapse. Reducing pressure is necessary to prevent progression. - Advised use of carbon fiber insoles and orthotics to reduce interdigital pressure. - Recommended continued use of cushioned footwear to minimize further ulceration.      Follow-Up Instructions: No follow-ups on file.  Ortho Exam  Patient is alert, oriented, no adenopathy, well-dressed, normal affect, normal respiratory effort. Physical Exam CARDIOVASCULAR: Dorsalis pedis pulse palpable bilaterally. MUSCULOSKELETAL: Significant hallux rigidus worse on right with large osteophytic bone spurs at MTP joint.  Right great toe impinged on second toe causing pressure ulceration between toes. Zero degrees dorsiflexion and ten degrees plantar flexion at MTP joint.      Imaging: No results found. No images are attached to the encounter.  Labs: Lab Results  Component Value Date   HGBA1C 5.3 08/16/2024   HGBA1C 5.5 11/07/2023   HGBA1C 5.5 10/25/2022   LABURIC 5.1 10/16/2024     Lab Results  Component Value Date   ALBUMIN 4.7 08/16/2024   ALBUMIN 4.6 11/07/2023   ALBUMIN 4.6 10/25/2022    No results found for: MG No results found for: VD25OH  No results found for: PREALBUMIN    Latest Ref Rng & Units 08/16/2024    1:34 PM 11/07/2023    9:16 AM 10/25/2022    8:02 AM  CBC EXTENDED  WBC 4.0 - 10.5 K/uL 6.5  5.4  4.1   RBC 4.22 - 5.81 Mil/uL 4.47  4.93  4.58   Hemoglobin 13.0 - 17.0 g/dL 85.7  84.4  85.4   HCT 39.0 - 52.0 % 41.6  45.8  41.8   Platelets 150.0 - 400.0 K/uL 255.0  251.0  202.0   NEUT# 1.4 - 7.7 K/uL 4.8  3.6  2.4   Lymph# 0.7 - 4.0 K/uL 1.3  1.3  1.3      There is no height or weight on file to calculate BMI.  Orders:  Orders Placed This Encounter  Procedures   XR Foot Complete Left   XR Foot Complete Right   Uric acid   No orders of the defined types were placed in this encounter.    Procedures: No procedures performed  Clinical Data: No additional findings.  ROS:  All other systems negative, except as noted in the HPI. Review of Systems  Objective: Vital Signs: There were no vitals taken for this visit.  Specialty Comments:  No specialty comments available.  PMFS History: Patient Active Problem List   Diagnosis Date Noted   Chronic cough 09/25/2024   Family history of lung cancer 09/25/2024   Chronic left hip pain 08/16/2024   Current smoker 11/07/2023   Benign prostatic hyperplasia without lower urinary tract symptoms 05/02/2023   History of BPH 04/28/2022   Coronary artery calcification 02/28/2017   Asthma    Past Medical  History:  Diagnosis Date   Asthma    caused by dust - no inhaler   Bradycardia    Chest pain    no current problems   COPD (chronic obstructive pulmonary disease) (HCC)     Family History  Problem Relation Age of Onset   Cancer Father    Arthritis Mother    Colon cancer Neg Hx    Rectal cancer Neg Hx    Stomach cancer Neg Hx     Past Surgical History:  Procedure Laterality Date   APPENDECTOMY     Social History   Occupational History   Occupation: Advertising Account Executive: [employer]    Comment: Earth Engineer, [employer]  Tobacco Use   Smoking status: Former    Current packs/day: 0.00    Average packs/day: 1 pack/day for 10.0 years (10.0 ttl pk-yrs)    Types: Cigarettes    Start date: 1982  Quit date: 31    Years since quitting: 34.0   Smokeless tobacco: Never  Vaping Use   Vaping status: Never Used  Substance and Sexual Activity   Alcohol use: Not Currently   Drug use: No    Types: Marijuana   Sexual activity: Yes         "

## 2025-09-23 ENCOUNTER — Ambulatory Visit

## 2025-09-23 ENCOUNTER — Encounter: Admitting: Emergency Medicine
